# Patient Record
Sex: Female | Born: 1990 | Hispanic: No | Marital: Married | Smoking: Never smoker
Health system: Southern US, Community
[De-identification: ages and names within clinical notes are randomized; demographics above are authoritative.]

## PROBLEM LIST (undated history)

## (undated) ENCOUNTER — Inpatient Hospital Stay (HOSPITAL_COMMUNITY): Payer: Self-pay

## (undated) DIAGNOSIS — O24419 Gestational diabetes mellitus in pregnancy, unspecified control: Secondary | ICD-10-CM

## (undated) HISTORY — PX: NO PAST SURGERIES: SHX2092

## (undated) HISTORY — DX: Gestational diabetes mellitus in pregnancy, unspecified control: O24.419

---

## 2017-07-04 NOTE — L&D Delivery Note (Signed)
Delivery Note At 10:57 AM a viable female was delivered via Vaginal, Spontaneous (Presentation: LOT ; LOA).  APGAR: 8, 9; weight pending  .   Placenta status: intact , spontaneous delivery .  Cord: three vessels with the following complications:none.  Cord pH: N/A  Anesthesia:  Epidural plus lidocaine for repair Episiotomy: None Lacerations: 4th degree Suture Repair: 2.0 and 4.0 Monocryl Est. Blood Loss (mL):  450  Mom to postpartum.  Baby to Couplet care / Skin to Skin.  Calvert CantorSamantha C Shaunie Boehm, CNM 02/17/2018, 11:41 AM

## 2017-12-15 ENCOUNTER — Ambulatory Visit (INDEPENDENT_AMBULATORY_CARE_PROVIDER_SITE_OTHER): Payer: Medicaid Other | Admitting: Obstetrics and Gynecology

## 2017-12-15 ENCOUNTER — Encounter: Payer: Self-pay | Admitting: Obstetrics and Gynecology

## 2017-12-15 ENCOUNTER — Other Ambulatory Visit (HOSPITAL_COMMUNITY)
Admission: RE | Admit: 2017-12-15 | Discharge: 2017-12-15 | Disposition: A | Discharge status: Home / Self Care | Payer: Medicaid Other | Source: Ambulatory Visit | Attending: Obstetrics and Gynecology | Admitting: Obstetrics and Gynecology

## 2017-12-15 VITALS — BP 123/71 | HR 120 | Ht 60.0 in | Wt 125.1 lb

## 2017-12-15 DIAGNOSIS — Z3403 Encounter for supervision of normal first pregnancy, third trimester: Secondary | ICD-10-CM

## 2017-12-15 DIAGNOSIS — Z23 Encounter for immunization: Secondary | ICD-10-CM | POA: Diagnosis not present

## 2017-12-15 DIAGNOSIS — B379 Candidiasis, unspecified: Secondary | ICD-10-CM

## 2017-12-15 DIAGNOSIS — Z3400 Encounter for supervision of normal first pregnancy, unspecified trimester: Secondary | ICD-10-CM | POA: Diagnosis present

## 2017-12-15 DIAGNOSIS — B9689 Other specified bacterial agents as the cause of diseases classified elsewhere: Secondary | ICD-10-CM | POA: Diagnosis not present

## 2017-12-15 DIAGNOSIS — O09899 Supervision of other high risk pregnancies, unspecified trimester: Secondary | ICD-10-CM

## 2017-12-15 DIAGNOSIS — O26899 Other specified pregnancy related conditions, unspecified trimester: Secondary | ICD-10-CM

## 2017-12-15 DIAGNOSIS — O26891 Other specified pregnancy related conditions, first trimester: Secondary | ICD-10-CM

## 2017-12-15 DIAGNOSIS — Z283 Underimmunization status: Secondary | ICD-10-CM

## 2017-12-15 DIAGNOSIS — Z6791 Unspecified blood type, Rh negative: Secondary | ICD-10-CM | POA: Insufficient documentation

## 2017-12-15 DIAGNOSIS — Z3401 Encounter for supervision of normal first pregnancy, first trimester: Secondary | ICD-10-CM

## 2017-12-15 DIAGNOSIS — O0990 Supervision of high risk pregnancy, unspecified, unspecified trimester: Secondary | ICD-10-CM | POA: Insufficient documentation

## 2017-12-15 DIAGNOSIS — O9989 Other specified diseases and conditions complicating pregnancy, childbirth and the puerperium: Secondary | ICD-10-CM

## 2017-12-15 NOTE — Progress Notes (Signed)
INITIAL PRENATAL VISIT NOTE  Subjective:  Joy [name] is a 27 y.o. G1P0 at 5133w3d by 10 weeks US being seen today for her initial prenatal visit. This is a planned pregnancy. She and partner are happy with the pregnancy but he is in [place] due to visa issues. She has an obstetric history significant for na/. She has a medical history significant for n/a.  Patient reports increased discharge in pregnancy.  Contractions: Not present. Vag. Bleeding: None.  Movement: Present. Denies leaking of fluid.   History reviewed. No pertinent past medical history.  History reviewed. No pertinent surgical history.  OB History  Gravida Para Term Preterm AB Living  1            SAB TAB Ectopic Multiple Live Births               # Outcome Date GA Lbr Len/2nd Weight Sex Delivery Anes PTL Lv  1 Current             Social History   Socioeconomic History  . Marital status: Married    Spouse name: Not on file  . Number of children: Not on file  . Years of education: Not on file  . Highest education level: Not on file  Occupational History  . Not on file  Social Needs  . Financial resource strain: Not on file  . Food insecurity:    Worry: Not on file    Inability: Not on file  . Transportation needs:    Medical: Not on file    Non-medical: Not on file  Tobacco Use  . Smoking status: Never Smoker  . Smokeless tobacco: Never Used  Substance and Sexual Activity  . Alcohol use: Never    Frequency: Never  . Drug use: Never  . Sexual activity: Not Currently    Birth control/protection: None  Lifestyle  . Physical activity:    Days per week: Not on file    Minutes per session: Not on file  . Stress: Not on file  Relationships  . Social connections:    Talks on phone: Not on file    Gets together: Not on file    Attends religious service: Not on file    Active member of club or organization: Not on file    Attends meetings of clubs or organizations: Not on file    Relationship  status: Not on file  Other Topics Concern  . Not on file  Social History Narrative  . Not on file    Family History  Problem Relation Age of Onset  . Diabetes Mother      (Not in a hospital admission)  No Known Allergies  Review of Systems: Negative except for what is mentioned in HPI.  Objective:   Vitals:   12/15/17 0926 12/15/17 0929  BP: 123/71   Pulse: (!) 120   Weight: 125 lb 1.6 oz (56.7 kg)   Height:  5' (1.524 m)    Fetal Status:   Fundal Height: 31 cm Movement: Present     Physical Exam: BP 123/71   Pulse (!) 120   Ht 5' (1.524 m)   Wt 125 lb 1.6 oz (56.7 kg)   LMP 05/16/2017   BMI 24.43 kg/m  CONSTITUTIONAL: Well-developed, well-nourished female in no acute distress.  NEUROLOGIC: Alert and oriented to person, place, and time. Normal reflexes, muscle tone coordination. No cranial nerve deficit noted. PSYCHIATRIC: Normal mood and affect. Normal behavior. Normal judgment and thought content. SKIN:  Skin is warm and dry. No rash noted. Not diaphoretic. No erythema. No pallor. HENT:  Normocephalic, atraumatic, External right and left ear normal. Oropharynx is clear and moist EYES: Conjunctivae and EOM are normal. Pupils are equal, round, and reactive to light. No scleral icterus.  NECK: Normal range of motion, supple, no masses CARDIOVASCULAR: Normal heart rate noted, regular rhythm RESPIRATORY: Effort and breath sounds normal, no problems with respiration noted BREASTS: symmetric, non-tender, no masses palpable ABDOMEN: Soft, nontender, nondistended, gravid. GU: normal appearing external female genitalia with 1 cm in length skin tag with appearance of soft cauliflower, verrucous in nature attached to the perineum, 3 mm small verrucous skin tag at posterior introitus, significant thick white discharge at introitus and throughout vagina, nulliparous normal appearing cervix, no lesions noted in vagina MUSCULOSKELETAL: Normal range of motion. EXT:  No edema and  no tenderness. 2+ distal pulses.   Assessment and Plan:  Pregnancy: G1P0 at [redacted]w[redacted]d by 10 weeks [place]  1. Supervision of normal first pregnancy, antepartum - Hemoglobinopathy Evaluation - Obstetric Panel, Including HIV - Culture, OB Urine - GC/Chlamydia probe amp (North)not at [facility] - SMN1 COPY NUMBER ANALYSIS (SMA Carrier Screen) - Hemoglobin A1c - CBC - HIV antibody - RPR - [place] MFM OB DETAIL +14 WK; Future - Glucose tolerance, 1 hour  2. Rh negative state in antepartum period O negative per records, patient reports B positive and has not gotten Rho Gam   Preterm labor symptoms and general obstetric precautions including but not limited to vaginal bleeding, contractions, leaking of fluid and fetal movement were reviewed in detail with the patient.  Please refer to After Visit Summary for other counseling recommendations.   Return in about 2 weeks (around 12/29/2017) for OB visit (MD).  Conan Bowens 12/15/2017 10:03 AM

## 2017-12-16 ENCOUNTER — Other Ambulatory Visit: Payer: Self-pay

## 2017-12-16 ENCOUNTER — Encounter (HOSPITAL_COMMUNITY): Payer: Self-pay

## 2017-12-16 ENCOUNTER — Inpatient Hospital Stay (HOSPITAL_COMMUNITY)
Admission: AD | Admit: 2017-12-16 | Discharge: 2017-12-16 | Disposition: A | Discharge status: Home / Self Care | Payer: Medicaid Other | Source: Ambulatory Visit | Attending: Obstetrics and Gynecology | Admitting: Obstetrics and Gynecology

## 2017-12-16 DIAGNOSIS — O368130 Decreased fetal movements, third trimester, not applicable or unspecified: Secondary | ICD-10-CM | POA: Insufficient documentation

## 2017-12-16 DIAGNOSIS — Z3689 Encounter for other specified antenatal screening: Secondary | ICD-10-CM

## 2017-12-16 DIAGNOSIS — Z3A30 30 weeks gestation of pregnancy: Secondary | ICD-10-CM

## 2017-12-16 DIAGNOSIS — Z833 Family history of diabetes mellitus: Secondary | ICD-10-CM | POA: Diagnosis not present

## 2017-12-16 LAB — URINALYSIS, ROUTINE W REFLEX MICROSCOPIC
Bilirubin Urine: NEGATIVE
Glucose, UA: NEGATIVE mg/dL
Hgb urine dipstick: NEGATIVE
Ketones, ur: NEGATIVE mg/dL
Nitrite: NEGATIVE
Protein, ur: NEGATIVE mg/dL
SPECIFIC GRAVITY, URINE: 1.005 (ref 1.005–1.030)
pH: 7 (ref 5.0–8.0)

## 2017-12-16 LAB — GLUCOSE TOLERANCE, 1 HOUR: GLUCOSE, 1HR PP: 159 mg/dL (ref 65–199)

## 2017-12-16 NOTE — MAU Provider Note (Signed)
History   [id number]   Chief Complaint  Patient presents with  . Decreased Fetal Movement    HPI Joy [name] is a 27 y.o. female  G1P0 here with report of decreased fetal movement since this mornign.  Reports feeling the baby move approximately two times in the past 24 hour.  Denies vaginal bleeding or leaking of fluid. Denies abdominal pain or contractions.   Patient's last menstrual period was 05/16/2017.  OB History  Gravida Para Term Preterm AB Living  1            SAB TAB Ectopic Multiple Live Births               # Outcome Date GA Lbr Len/2nd Weight Sex Delivery Anes PTL Lv  1 Current             Past Medical History:  Diagnosis Date  . Medical history non-contributory     Family History  Problem Relation Age of Onset  . Diabetes Mother     Social History   Socioeconomic History  . Marital status: Married    Spouse name: Not on file  . Number of children: Not on file  . Years of education: Not on file  . Highest education level: Not on file  Occupational History  . Not on file  Social Needs  . Financial resource strain: Not on file  . Food insecurity:    Worry: Not on file    Inability: Not on file  . Transportation needs:    Medical: Not on file    Non-medical: Not on file  Tobacco Use  . Smoking status: Never Smoker  . Smokeless tobacco: Never Used  Substance and Sexual Activity  . Alcohol use: Never    Frequency: Never  . Drug use: Never  . Sexual activity: Not Currently    Birth control/protection: None  Lifestyle  . Physical activity:    Days per week: Not on file    Minutes per session: Not on file  . Stress: Not on file  Relationships  . Social connections:    Talks on phone: Not on file    Gets together: Not on file    Attends religious service: Not on file    Active member of club or organization: Not on file    Attends meetings of clubs or organizations: Not on file    Relationship status: Not on file  Other Topics Concern  .  Not on file  Social History Narrative  . Not on file    No Known Allergies  No current facility-administered medications on file prior to encounter.    Current Outpatient Medications on File Prior to Encounter  Medication Sig Dispense Refill  . Prenatal Multivit-Min-Fe-FA (PRENATAL VITAMINS PO) Take by mouth.       Review of Systems  Constitutional: Negative.   Gastrointestinal: Negative.   Genitourinary: Negative.    Physical Exam   Vitals:   12/16/17 1709 12/16/17 1710  BP:  122/79  Pulse:  (!) 108  Resp:  16  Temp:  98.1 F (36.7 C)  TempSrc:  Oral  SpO2:  100%  Weight: 127 lb 1.9 oz (57.7 kg)     Physical Exam  Nursing note and vitals reviewed. Constitutional: She is oriented to person, place, and time. She appears well-developed and well-nourished. No distress.  HENT:  Head: Normocephalic and atraumatic.  Eyes: Conjunctivae are normal. Right eye exhibits no discharge. Left eye exhibits no discharge. No scleral icterus.  Neck: Normal range of motion.  Respiratory: Effort normal. No respiratory distress.  GI: Soft. There is no tenderness.  Neurological: She is alert and oriented to person, place, and time.  Skin: Skin is warm and dry. She is not diaphoretic.  Psychiatric: She has a normal mood and affect. Her behavior is normal. Judgment and thought content normal.    MAU Course  Procedures Results for orders placed or performed during the hospital encounter of 12/16/17 (from the past 24 hour(s))  Urinalysis, Routine w reflex microscopic     Status: Abnormal   Collection Time: 12/16/17  5:22 PM  Result Value Ref Range   Color, Urine YELLOW YELLOW   APPearance CLEAR CLEAR   Specific Gravity, Urine 1.005 1.005 - 1.030   pH 7.0 5.0 - 8.0   Glucose, UA NEGATIVE NEGATIVE mg/dL   Hgb urine dipstick NEGATIVE NEGATIVE   Bilirubin Urine NEGATIVE NEGATIVE   Ketones, ur NEGATIVE NEGATIVE mg/dL   Protein, ur NEGATIVE NEGATIVE mg/dL   Nitrite NEGATIVE NEGATIVE    Leukocytes, UA TRACE (A) NEGATIVE   RBC / HPF 0-5 0 - 5 RBC/hpf   WBC, UA 0-5 0 - 5 WBC/hpf   Bacteria, UA RARE (A) NONE SEEN   Squamous Epithelial / LPF 0-5 0 - 5   Mucus PRESENT     MDM NST:  Baseline: 130 bpm, Variability: Good {> 6 bpm), Accelerations: Reactive and Decelerations: Absent  Reactive NST Fetal movement heard through monitor and felt during abdominal exam Pt reassured  Assessment and Plan  A: 1. Decreased fetal movements in third trimester, single or unspecified fetus   2. NST (non-stress test) reactive   3. [redacted] weeks gestation of pregnancy    P; Discharge home Discussed fetal movement & given fetal movement form Discussed reasons to return to MAU   Judeth Horn, NP 12/16/2017 5:49 PM

## 2017-12-16 NOTE — Progress Notes (Addendum)
G1 @ 30.[redacted] wksga. Here dt decreased FM. Last time felt baby move last night. Denies LOF or bleeding.   EFM applied with slight tilt to right side.

## 2017-12-16 NOTE — MAU Note (Signed)
Joy HutchingSadia [name] is a 27 y.o. at 5721w4d here in MAU reporting:  +decreased fetal movement. Last felt baby move yesterday. No movement today.  Pain score: none Vitals:   12/16/17 1710  BP: 122/79  Pulse: (!) 108  Resp: 16  Temp: 98.1 F (36.7 C)  SpO2: 100%     FHT:128 Lab orders placed from triage:  ua

## 2017-12-16 NOTE — Discharge Instructions (Signed)

## 2017-12-18 LAB — CULTURE, OB URINE

## 2017-12-18 LAB — URINE CULTURE, OB REFLEX

## 2017-12-18 LAB — CERVICOVAGINAL ANCILLARY ONLY
CHLAMYDIA, DNA PROBE: NEGATIVE
Candida vaginitis: POSITIVE — AB
NEISSERIA GONORRHEA: NEGATIVE

## 2017-12-18 MED ORDER — CLOTRIMAZOLE 1 % VA CREA: 1.0000 | TOPICAL_CREAM | Freq: Every day | VAGINAL | 0 refills | Status: DC

## 2017-12-18 NOTE — Addendum Note (Signed)
Addended by: Leroy LibmanAVIS, Murphy Bundick on: 12/18/2017 01:41 PM   Modules accepted: Orders

## 2017-12-21 LAB — HEMOGLOBINOPATHY EVALUATION
Ferritin: 23 ng/mL (ref 15–150)
HGB SOLUBILITY: NEGATIVE
HGB VARIANT: 0 %
Hgb A2 Quant: 1.9 % (ref 1.8–3.2)
Hgb A: 98.1 % (ref 96.4–98.8)
Hgb C: 0 %
Hgb F Quant: 0 % (ref 0.0–2.0)
Hgb S: 0 %

## 2017-12-21 LAB — SMN1 COPY NUMBER ANALYSIS (SMA CARRIER SCREENING)

## 2017-12-21 LAB — OBSTETRIC PANEL, INCLUDING HIV
Antibody Screen: NEGATIVE
Basophils Absolute: 0 10*3/uL (ref 0.0–0.2)
Basos: 0 %
EOS (ABSOLUTE): 0.1 10*3/uL (ref 0.0–0.4)
EOS: 1 %
HEMOGLOBIN: 10.2 g/dL — AB (ref 11.1–15.9)
HEP B S AG: NEGATIVE
HIV Screen 4th Generation wRfx: NONREACTIVE
Hematocrit: 31 % — ABNORMAL LOW (ref 34.0–46.6)
IMMATURE GRANULOCYTES: 4 %
Immature Grans (Abs): 0.5 10*3/uL — ABNORMAL HIGH (ref 0.0–0.1)
LYMPHS: 12 %
Lymphocytes Absolute: 1.5 10*3/uL (ref 0.7–3.1)
MCH: 28 pg (ref 26.6–33.0)
MCHC: 32.9 g/dL (ref 31.5–35.7)
MCV: 85 fL (ref 79–97)
MONOS ABS: 1.1 10*3/uL — AB (ref 0.1–0.9)
Monocytes: 8 %
NEUTROS PCT: 75 %
Neutrophils Absolute: 9.8 10*3/uL — ABNORMAL HIGH (ref 1.4–7.0)
Platelets: 310 10*3/uL (ref 150–450)
RBC: 3.64 x10E6/uL — AB (ref 3.77–5.28)
RDW: 19.2 % — ABNORMAL HIGH (ref 12.3–15.4)
RH TYPE: NEGATIVE
RPR: NONREACTIVE
WBC: 13 10*3/uL — ABNORMAL HIGH (ref 3.4–10.8)

## 2017-12-21 LAB — HEMOGLOBIN A1C
Est. average glucose Bld gHb Est-mCnc: 108 mg/dL
HEMOGLOBIN A1C: 5.4 % (ref 4.8–5.6)

## 2017-12-22 ENCOUNTER — Encounter (HOSPITAL_COMMUNITY): Payer: Self-pay

## 2017-12-22 ENCOUNTER — Ambulatory Visit (HOSPITAL_COMMUNITY)
Admission: RE | Admit: 2017-12-22 | Discharge: 2017-12-22 | Disposition: A | Discharge status: Home / Self Care | Payer: Medicaid Other | Source: Ambulatory Visit | Attending: Obstetrics and Gynecology | Admitting: Obstetrics and Gynecology

## 2017-12-22 DIAGNOSIS — Z363 Encounter for antenatal screening for malformations: Secondary | ICD-10-CM | POA: Diagnosis not present

## 2017-12-22 DIAGNOSIS — Z6791 Unspecified blood type, Rh negative: Secondary | ICD-10-CM

## 2017-12-22 DIAGNOSIS — Z3A31 31 weeks gestation of pregnancy: Secondary | ICD-10-CM | POA: Insufficient documentation

## 2017-12-22 DIAGNOSIS — O26899 Other specified pregnancy related conditions, unspecified trimester: Secondary | ICD-10-CM

## 2017-12-22 DIAGNOSIS — Z3400 Encounter for supervision of normal first pregnancy, unspecified trimester: Secondary | ICD-10-CM | POA: Diagnosis present

## 2017-12-25 DIAGNOSIS — O09899 Supervision of other high risk pregnancies, unspecified trimester: Secondary | ICD-10-CM | POA: Insufficient documentation

## 2017-12-25 DIAGNOSIS — Z283 Underimmunization status: Secondary | ICD-10-CM | POA: Insufficient documentation

## 2017-12-25 DIAGNOSIS — O9989 Other specified diseases and conditions complicating pregnancy, childbirth and the puerperium: Secondary | ICD-10-CM

## 2017-12-28 ENCOUNTER — Encounter: Payer: Self-pay | Admitting: Obstetrics and Gynecology

## 2018-01-03 ENCOUNTER — Ambulatory Visit (INDEPENDENT_AMBULATORY_CARE_PROVIDER_SITE_OTHER): Payer: Medicaid Other | Admitting: Obstetrics & Gynecology

## 2018-01-03 ENCOUNTER — Encounter: Payer: Self-pay | Admitting: Obstetrics & Gynecology

## 2018-01-03 VITALS — BP 112/62 | HR 96 | Wt 130.0 lb

## 2018-01-03 DIAGNOSIS — Z3400 Encounter for supervision of normal first pregnancy, unspecified trimester: Secondary | ICD-10-CM

## 2018-01-03 DIAGNOSIS — O360930 Maternal care for other rhesus isoimmunization, third trimester, not applicable or unspecified: Secondary | ICD-10-CM

## 2018-01-03 DIAGNOSIS — O09899 Supervision of other high risk pregnancies, unspecified trimester: Secondary | ICD-10-CM

## 2018-01-03 DIAGNOSIS — Z3403 Encounter for supervision of normal first pregnancy, third trimester: Secondary | ICD-10-CM

## 2018-01-03 DIAGNOSIS — O9989 Other specified diseases and conditions complicating pregnancy, childbirth and the puerperium: Secondary | ICD-10-CM

## 2018-01-03 DIAGNOSIS — Z283 Underimmunization status: Secondary | ICD-10-CM

## 2018-01-03 DIAGNOSIS — Z6791 Unspecified blood type, Rh negative: Secondary | ICD-10-CM

## 2018-01-03 DIAGNOSIS — O26899 Other specified pregnancy related conditions, unspecified trimester: Secondary | ICD-10-CM

## 2018-01-03 MED ORDER — RHO D IMMUNE GLOBULIN 1500 UNIT/2ML IJ SOSY
300.0000 ug | PREFILLED_SYRINGE | Freq: Once | INTRAMUSCULAR | Status: AC
  Administered 2018-01-03: 300 ug via INTRAMUSCULAR

## 2018-01-03 NOTE — Patient Instructions (Signed)
Rh0 [D] Immune Globulin injection  What is this medicine?  RhO [D] IMMUNE GLOBULIN (i MYOON GLOB yoo lin) is used to treat idiopathic thrombocytopenic purpura (ITP). This medicine is used in RhO negative mothers who are pregnant with a RhO positive child. It is also used after a transfusion of RhO positive blood into a RhO negative person.  This medicine may be used for other purposes; ask your health care provider or pharmacist if you have questions.  COMMON BRAND NAME(S): BayRho-D, HyperRHO S/D, MICRhoGAM, RhoGAM, Rhophylac, WinRho SDF  What should I tell my health care provider before I take this medicine?  They need to know if you have any of these conditions:  -bleeding disorders  -low levels of immunoglobulin A in the body  -no spleen  -an unusual or allergic reaction to human immune globulin, other medicines, foods, dyes, or preservatives  -pregnant or trying to get pregnant  -breast-feeding  How should I use this medicine?  This medicine is for injection into a muscle or into a vein. It is given by a health care professional in a hospital or clinic setting.  Talk to your pediatrician regarding the use of this medicine in children. This medicine is not approved for use in children.  Overdosage: If you think you have taken too much of this medicine contact a poison control center or emergency room at once.  NOTE: This medicine is only for you. Do not share this medicine with others.  What if I miss a dose?  It is important not to miss your dose. Call your doctor or health care professional if you are unable to keep an appointment.  What may interact with this medicine?  -live virus vaccines, like measles, mumps, or rubella  This list may not describe all possible interactions. Give your health care provider a list of all the medicines, herbs, non-prescription drugs, or dietary supplements you use. Also tell them if you smoke, drink alcohol, or use illegal drugs. Some items may interact with your medicine.   What should I watch for while using this medicine?  This medicine is made from human blood. It may be possible to pass an infection in this medicine. Talk to your doctor about the risks and benefits of this medicine.  This medicine may interfere with live virus vaccines. Before you get live virus vaccines tell your health care professional if you have received this medicine within the past 3 months.  What side effects may I notice from receiving this medicine?  Side effects that you should report to your doctor or health care professional as soon as possible:  -allergic reactions like skin rash, itching or hives, swelling of the face, lips, or tongue  -breathing problems  -chest pain or tightness  -yellowing of the eyes or skin  Side effects that usually do not require medical attention (report to your doctor or health care professional if they continue or are bothersome):  -fever  -pain and tenderness at site where injected  This list may not describe all possible side effects. Call your doctor for medical advice about side effects. You may report side effects to FDA at 1-800-FDA-1088.  Where should I keep my medicine?  This drug is given in a hospital or clinic and will not be stored at home.  NOTE: This sheet is a summary. It may not cover all possible information. If you have questions about this medicine, talk to your doctor, pharmacist, or health care provider.  © 2018 [employer]/Gold Standard (  2008-02-18 14:06:10)

## 2018-01-03 NOTE — Progress Notes (Signed)
   PRENATAL VISIT NOTE  Subjective:  Joy [name] is a 27 y.o. G1P0 at 78w1dbeing seen today for ongoing prenatal care.  She is currently monitored for the following issues for this low-risk pregnancy and has Supervision of normal first pregnancy, antepartum; Rh negative state in antepartum period; and Rubella non-immune status, antepartum on their problem list.  Patient reports URI sx- improving.  Contractions: Not present. Vag. Bleeding: None.  Movement: Present. Denies leaking of fluid.   The following portions of the patient's history were reviewed and updated as appropriate: allergies, current medications, past family history, past medical history, past social history, past surgical history and problem list. Problem list updated.  Objective:   Vitals:   01/03/18 1537  BP: 112/62  Pulse: 96  Weight: 130 lb (59 kg)    Fetal Status:     Movement: Present     General:  Alert, oriented and cooperative. Patient is in no acute distress.  Skin: Skin is warm and dry. No rash noted.   Cardiovascular: Normal heart rate noted  Respiratory: Normal respiratory effort, no problems with respiration noted  Abdomen: Soft, gravid, appropriate for gestational age.  Pain/Pressure: Present     Pelvic: Cervical exam deferred        Extremities: Normal range of motion.  Edema: None  Mental Status: Normal mood and affect. Normal behavior. Normal judgment and thought content.   Assessment and Plan:  Pregnancy: G1P0 at 32w1d1. Supervision of normal first pregnancy, antepartum D/w pt [place] had [place] anatomy that was WNL. Pt does not report a need for a f/u [place]  2. Rubella non-immune status, antepartum Needs MMR PP  3. Rh negative state in antepartum period Given Rhogam today  4. Anemia Begin feso4 in addition to PNV   Preterm labor symptoms and general obstetric precautions including but not limited to vaginal bleeding, contractions, leaking of fluid and fetal movement were  reviewed in detail with the patient. Please refer to After Visit Summary for other counseling recommendations.  Return in about 2 weeks (around 01/17/2018).  No future appointments.  CaLavonia DraftsMD

## 2018-01-08 ENCOUNTER — Encounter: Payer: Self-pay | Admitting: Obstetrics & Gynecology

## 2018-01-08 ENCOUNTER — Encounter (HOSPITAL_COMMUNITY): Payer: Self-pay | Admitting: *Deleted

## 2018-01-08 ENCOUNTER — Inpatient Hospital Stay (HOSPITAL_COMMUNITY)
Admission: AD | Admit: 2018-01-08 | Discharge: 2018-01-09 | Disposition: A | Discharge status: Home / Self Care | Payer: Medicaid Other | Source: Ambulatory Visit | Attending: Obstetrics and Gynecology | Admitting: Obstetrics and Gynecology

## 2018-01-08 DIAGNOSIS — M545 Low back pain, unspecified: Secondary | ICD-10-CM

## 2018-01-08 DIAGNOSIS — O9989 Other specified diseases and conditions complicating pregnancy, childbirth and the puerperium: Secondary | ICD-10-CM

## 2018-01-08 DIAGNOSIS — Z3400 Encounter for supervision of normal first pregnancy, unspecified trimester: Secondary | ICD-10-CM

## 2018-01-08 DIAGNOSIS — O4703 False labor before 37 completed weeks of gestation, third trimester: Secondary | ICD-10-CM | POA: Diagnosis not present

## 2018-01-08 DIAGNOSIS — O98813 Other maternal infectious and parasitic diseases complicating pregnancy, third trimester: Secondary | ICD-10-CM | POA: Diagnosis not present

## 2018-01-08 DIAGNOSIS — O26899 Other specified pregnancy related conditions, unspecified trimester: Secondary | ICD-10-CM

## 2018-01-08 DIAGNOSIS — Z6791 Unspecified blood type, Rh negative: Secondary | ICD-10-CM | POA: Insufficient documentation

## 2018-01-08 DIAGNOSIS — Z3A33 33 weeks gestation of pregnancy: Secondary | ICD-10-CM | POA: Insufficient documentation

## 2018-01-08 DIAGNOSIS — N859 Noninflammatory disorder of uterus, unspecified: Secondary | ICD-10-CM

## 2018-01-08 DIAGNOSIS — O479 False labor, unspecified: Secondary | ICD-10-CM

## 2018-01-08 DIAGNOSIS — B373 Candidiasis of vulva and vagina: Secondary | ICD-10-CM | POA: Diagnosis not present

## 2018-01-08 DIAGNOSIS — B3731 Acute candidiasis of vulva and vagina: Secondary | ICD-10-CM

## 2018-01-08 DIAGNOSIS — Z2839 Other underimmunization status: Secondary | ICD-10-CM

## 2018-01-08 DIAGNOSIS — O26893 Other specified pregnancy related conditions, third trimester: Secondary | ICD-10-CM | POA: Diagnosis not present

## 2018-01-08 DIAGNOSIS — R109 Unspecified abdominal pain: Secondary | ICD-10-CM | POA: Diagnosis present

## 2018-01-08 DIAGNOSIS — Z283 Underimmunization status: Secondary | ICD-10-CM

## 2018-01-08 DIAGNOSIS — N858 Other specified noninflammatory disorders of uterus: Secondary | ICD-10-CM

## 2018-01-08 LAB — URINALYSIS, ROUTINE W REFLEX MICROSCOPIC
Bilirubin Urine: NEGATIVE
GLUCOSE, UA: NEGATIVE mg/dL
HGB URINE DIPSTICK: NEGATIVE
KETONES UR: NEGATIVE mg/dL
NITRITE: NEGATIVE
PROTEIN: NEGATIVE mg/dL
Specific Gravity, Urine: 1.005 (ref 1.005–1.030)
pH: 7 (ref 5.0–8.0)

## 2018-01-08 LAB — WET PREP, GENITAL
Clue Cells Wet Prep HPF POC: NONE SEEN
Sperm: NONE SEEN
Trich, Wet Prep: NONE SEEN

## 2018-01-08 MED ORDER — LACTATED RINGERS IV BOLUS
1000.0000 mL | Freq: Once | INTRAVENOUS | Status: DC
  Administered 2018-01-08: 1000 mL via INTRAVENOUS

## 2018-01-08 NOTE — MAU Provider Note (Signed)
Chief Complaint:  Abdominal Pain   First Provider Initiated Contact with Patient 01/08/18 2249      HPI: Joy [name] is a 27 y.o. G1P0 at [redacted]w[redacted]d who presents to MAU reporting cramps that started this morning. States having abdominal pain that feels like period cramps. Pain located in lower abdomen and back. Moving makes it worse and sitting makes it feel better. Pain comes and goes. Denies leakage of fluid, vaginal discharge, or vaginal bleeding. Good fetal movement.   Pregnancy Course:  Receives prenatal care at [facility]  Past Medical History: Past Medical History:  Diagnosis Date  . Medical history non-contributory     Past obstetric history: OB History  Gravida Para Term Preterm AB Living  1         0  SAB TAB Ectopic Multiple Live Births               # Outcome Date GA Lbr Len/2nd Weight Sex Delivery Anes PTL Lv  1 Current             Past Surgical History: Past Surgical History:  Procedure Laterality Date  . NO PAST SURGERIES       Family History: Family History  Problem Relation Age of Onset  . Diabetes Mother     Social History: Social History   Tobacco Use  . Smoking status: Never Smoker  . Smokeless tobacco: Never Used  Substance Use Topics  . Alcohol use: Never    Frequency: Never  . Drug use: Never    Allergies: No Known Allergies  Meds:  Medications Prior to Admission  Medication Sig Dispense Refill Last Dose  . clotrimazole (GYNE-LOTRIMIN) 1 % vaginal cream Place 1 Applicatorful vaginally at bedtime. (Patient not taking: Reported on 01/03/2018) 30 g 0 Not Taking  . Prenatal Multivit-Min-Fe-FA (PRENATAL VITAMINS PO) Take by mouth.   Taking    I have reviewed patient's Past Medical Hx, Surgical Hx, Family Hx, Social Hx, medications and allergies.   ROS:  All systems reviewed and are negative for acute change except as noted in the HPI.   Physical Exam   Patient Vitals for the past 24 hrs:  BP Temp Temp src Pulse Resp Height Weight  01/08/18  2133 111/62 97.8 F (36.6 C) Oral 97 18 5' (1.524 m) 59.8 kg (131 lb 12 oz)   Constitutional: Well-developed, well-nourished female in no acute distress.  Cardiovascular: normal rate and rhythm, pulses intact Respiratory: normal rate and effort.  GI: Abd soft, non-tender, gravid appropriate for gestational age.  MS: Extremities nontender, no edema, normal ROM Neurologic: Alert and oriented x 4.  GU: Neg CVAT. Pelvic: NEFG, thick white vaginal discharge, no blood, cervix clean. No CMT Psych: normal mood and affect  Dilation: Fingertip Effacement (%): Thick Station: Ballotable Exam by:: Doroteo Glassman, MD    Labs: Results for orders placed or performed during the hospital encounter of 01/08/18 (from the past 24 hour(s))  Urinalysis, Routine w reflex microscopic     Status: Abnormal   Collection Time: 01/08/18  9:47 PM  Result Value Ref Range   Color, Urine STRAW (A) YELLOW   APPearance CLEAR CLEAR   Specific Gravity, Urine 1.005 1.005 - 1.030   pH 7.0 5.0 - 8.0   Glucose, UA NEGATIVE NEGATIVE mg/dL   Hgb urine dipstick NEGATIVE NEGATIVE   Bilirubin Urine NEGATIVE NEGATIVE   Ketones, ur NEGATIVE NEGATIVE mg/dL   Protein, ur NEGATIVE NEGATIVE mg/dL   Nitrite NEGATIVE NEGATIVE   Leukocytes, UA LARGE (A) NEGATIVE  RBC / HPF 0-5 0 - 5 RBC/hpf   WBC, UA 6-10 0 - 5 WBC/hpf   Bacteria, UA RARE (A) NONE SEEN   Squamous Epithelial / LPF 0-5 0 - 5   Mucus PRESENT     Imaging:  [place] Mfm Ob Detail +14 Wk  Result Date: 12/22/2017 ----------------------------------------------------------------------  OBSTETRICS REPORT                      (Signed Final 12/22/2017 02:44 pm) ---------------------------------------------------------------------- Patient Info  ID #:       [id number]                          D.O.B.:  1990/10/25 (27 yrs)  Name:       Joy [name]                     Visit Date: 12/22/2017 01:27 pm ---------------------------------------------------------------------- Performed By   Performed By:     Emeline DarlingKasie E Kiser BS,      [address]. [address]     [address]                    [place]                                                             [address]  Attending:        Charlsie MerlesMark Newman MD         [facility]  Referred By:      Conan BowensKELLY M DAVIS                    MD ---------------------------------------------------------------------- Orders   #  Description                                 Code   1  US MFM OB DETAIL +14 WK                     76811.01  ----------------------------------------------------------------------   #  Ordered By               Order #        Accession #    Episode #   1  Leroy LibmanKELLY DAVIS              [id number]      [id number]     [id number]  ---------------------------------------------------------------------- Indications   [redacted] weeks gestation of pregnancy                Z3A.31   Encounter for antenatal screening for          Z36.3   malformations   Transfer of Care  ---------------------------------------------------------------------- OB History  Blood Type:            Height:  5'     Weight (lb):  126       BMI:  24.61  Gravidity:    1 ---------------------------------------------------------------------- Fetal Evaluation  Num Of Fetuses:     1  Fetal Heart         122  Rate(bpm):  Cardiac Activity:  Observed  Presentation:       Cephalic  Placenta:           Posterior Fundal, above cervical os  P. Cord Insertion:  Visualized  Amniotic Fluid  AFI FV:      Subjectively within normal limits  AFI Sum(cm)     %Tile       Largest Pocket(cm)  12.84           38          3.97  RUQ(cm)       RLQ(cm)       LUQ(cm)        LLQ(cm)  2.14          3.32          3.97           3.42 ---------------------------------------------------------------------- Biometry  BPD:      81.7  mm     G. Age:  32w 6d         80  %    CI:        78.53   %    70 - 86                                                          FL/HC:      20.4   %    19.3 - 21.3  HC:      291.6  mm     G. Age:  32w 1d          33  %    HC/AC:      1.02        0.96 - 1.17  AC:      285.5  mm     G. Age:  32w 4d         79  %    FL/BPD:     72.8   %    71 - 87  FL:       59.5  mm     G. Age:  31w 0d         25  %    FL/AC:      20.8   %    20 - 24  HUM:        54  mm     G. Age:  31w 3d         52  %  Est. FW:    1899  gm      4 lb 3 oz     68  % ---------------------------------------------------------------------- Gestational Age  LMP:           31w 3d        Date:  05/16/17                 EDD:   02/20/18  U/S Today:     32w 1d                                        EDD:   02/15/18  Best:          31w 3d     Det. By:  LMP  (05/16/17)  EDD:   02/20/18 ---------------------------------------------------------------------- Anatomy  Cranium:               Appears normal         Aortic Arch:            Not well visualized  Cavum:                 Appears normal         Ductal Arch:            Not well visualized  Ventricles:            Appears normal         Diaphragm:              Appears normal  Choroid Plexus:        Appears normal         Stomach:                Appears normal, left                                                                        sided  Cerebellum:            Appears normal         Abdomen:                Appears normal  Posterior Fossa:       Appears normal         Abdominal Wall:         Not well visualized  Nuchal Fold:           Not applicable (>20    Cord Vessels:           Appears normal ([redacted]                         wks GA)                                        vessel cord)  Face:                  Appears normal         Kidneys:                Appear normal                         (orbits and profile)  Lips:                  Appears normal         Bladder:                Appears normal  Thoracic:              Appears normal         Spine:                  Appears normal  Heart:  Not well visualized    Upper Extremities:      Not well visualized  RVOT:                  Appears normal          Lower Extremities:      Appears normal  LVOT:                  Appears normal  Other:  Female gender. Technically difficult due to fetal position. ---------------------------------------------------------------------- Cervix Uterus Adnexa  Cervix  Not visualized (advanced GA >29wks) ---------------------------------------------------------------------- Impression  Singleton intrauterine pregnancy at 31+3 weeks here for  anatomic survey  Review of the anatomy shows no sonographic markers for  aneuploidy or structural anomalies  However, views of the fetal heart and distal extremities  should be considered suboptimal secondary to fetal position  Amniotic fluid volume is normal  Estimated fetal weight shows growth in the 68th percentile ---------------------------------------------------------------------- Recommendations  Recommend follow-up ultrasound examination in 4 weeks for  completion of the anatomic survey ----------------------------------------------------------------------                 Charlsie Merles, MD Electronically Signed Final Report   12/22/2017 02:44 pm ----------------------------------------------------------------------   MAU Management/MDM: Vitals and nursing notes reviewed  UA unremarkable. Wet prep with signs of yeast vaginitis. This could be the course of some of her uterine irritaibility. The abdominal pain sounds like Braxton-Hicks contractions. Educated mother on what this is since this is her first pregnancy. Cervix is closed. No contractions on monitor. Rule out PTL.   Orders Placed This Encounter  Procedures  . Wet prep, genital  . OB Urine Culture  . Urinalysis, Routine w reflex microscopic    Meds ordered this encounter  Medications  . lactated ringers bolus 1,000 mL    Plan of care reviewed with patient, including labs and tests ordered and medical treatment.  Consult none.  Treatments in MAU included IV fluid bolus.   I personally reviewed the patient's  NST today, found to be REACTIVE. 125 bpm, mod var, +accels, no decels. CTX: occasional  ASSESSMENT 1. Yeast vaginitis   2. Rubella non-immune status, antepartum   3. Supervision of normal first pregnancy, antepartum   4. Rh negative state in antepartum period   5. Uterine irritability   6. Acute left-sided low back pain without sciatica   7. Braxton Hick's contraction     PLAN Discharge home in stable condition. Counseled on return precautions Labor precautions given Follow-up with OB provider Handout given    Allergies as of 01/09/2018   No Known Allergies     Medication List    STOP taking these medications   clotrimazole 1 % vaginal cream Commonly known as:  GYNE-LOTRIMIN     TAKE these medications   miconazole 2 % vaginal cream Commonly known as:  MONISTAT 7 Place 1 Applicatorful vaginally at bedtime for 7 days.   PRENATAL VITAMINS PO Take by mouth.       Caryl Ada, DO OB Fellow [facility] 01/08/2018, 11:00 PM

## 2018-01-08 NOTE — MAU Note (Signed)
PT SAYS  CRAMPING STARTED THIS AM.     AT 730- WHILE WASHING DISHES - BACK AND ABD HURTING.   SITTING FEELS BETTER.  GETS   [facility] WITH [facility] -

## 2018-01-08 NOTE — MAU Note (Signed)
Urine in lab 

## 2018-01-09 DIAGNOSIS — B373 Candidiasis of vulva and vagina: Secondary | ICD-10-CM | POA: Diagnosis not present

## 2018-01-09 MED ORDER — MICONAZOLE NITRATE 2 % VA CREA: 1.0000 | TOPICAL_CREAM | Freq: Every day | VAGINAL | 0 refills | Status: AC

## 2018-01-09 NOTE — Discharge Instructions (Signed)

## 2018-01-10 LAB — CULTURE, OB URINE

## 2018-01-17 ENCOUNTER — Ambulatory Visit (INDEPENDENT_AMBULATORY_CARE_PROVIDER_SITE_OTHER): Payer: Medicaid Other | Admitting: Obstetrics & Gynecology

## 2018-01-17 VITALS — BP 114/69 | HR 106 | Wt 133.1 lb

## 2018-01-17 DIAGNOSIS — Z3403 Encounter for supervision of normal first pregnancy, third trimester: Secondary | ICD-10-CM

## 2018-01-17 DIAGNOSIS — O26899 Other specified pregnancy related conditions, unspecified trimester: Secondary | ICD-10-CM

## 2018-01-17 DIAGNOSIS — O26893 Other specified pregnancy related conditions, third trimester: Secondary | ICD-10-CM

## 2018-01-17 DIAGNOSIS — Z6791 Unspecified blood type, Rh negative: Secondary | ICD-10-CM

## 2018-01-17 DIAGNOSIS — Z283 Underimmunization status: Secondary | ICD-10-CM

## 2018-01-17 DIAGNOSIS — O9989 Other specified diseases and conditions complicating pregnancy, childbirth and the puerperium: Secondary | ICD-10-CM

## 2018-01-17 DIAGNOSIS — Z2839 Other underimmunization status: Secondary | ICD-10-CM

## 2018-01-17 DIAGNOSIS — Z3400 Encounter for supervision of normal first pregnancy, unspecified trimester: Secondary | ICD-10-CM

## 2018-01-17 NOTE — Patient Instructions (Signed)
Natural Childbirth Natural childbirth is going through labor and delivery without any drugs to relieve pain. Additionally, fetal monitors are not used, a delivery is not done, and a surgical cut to enlarge the vaginal opening (episiotomy) is not made. With the help of a birthing professional (midwife or health care provider), you direct your own labor and delivery. Many women choose natural childbirth because it makes them feel more in control and in touch with their labor and delivery. Some woman also choose natural childbirth because they are concerned about medicines affecting them and their babies. Pregnant women with a high-risk pregnancy should not attempt natural childbirth. It is better to deliver the infant in a hospital if an emergency situation arises. Sometimes, a health care provider has to get involved for the health and safety of the mother and infant. Techniques for natural childbirth  The Lamaze method-This method teaches parents that having a baby is normal, healthy, and natural. It also teaches the mother to take a neutral position regarding pain medicine and numbing medicines and to make an informed decision about using these medicines when the time comes.  The Bradley method (also called husband-coached birth)-This method teaches the father or partner to be the birth coach. It encourages the mother to exercise and eat a balanced, nutritious diet. It also involves relaxation and deep breathing exercises and preparing the parents for emergency situations. Methods of dealing with labor pain and delivery  Meditation.  Yoga.  Hypnosis.  Acupuncture.  Massage.  Changing positions (walking, rocking, showering, leaning on birth balls).  Lying in warm water or a whirlpool bath.  Finding an activity that keeps your mind off of the labor pain.  Listening to soft music.  Focusing on a particular object (visual imagery). Before going into labor  Be sure you and your spouse or  partner are in agreement about having a natural childbirth.  Decide if your health care provider or a midwife will deliver your baby.  Decide if you will have your baby in the hospital, at a birthing center, or at home.  If you have children, make plans to have someone take care of them when you go to the hospital or birthing center.  Know the distance and the time it takes to go to the hospital or birthing center. Practice going there and time it to be sure.  Have a bag packed with a nightgown, bathrobe, and toiletries. Be ready to take it with you when you go into labor.  Keep phone numbers of your family and friends handy if you need to call someone when you go into labor.  Your spouse or partner should go to all the natural childbirth technique classes.  Talk with your health care provider about the possibility of a medical emergency and what will happen if that occurs. Advantages of natural childbirth  You are in control of your labor and delivery.  You will not take medicines that could affect you and the baby.  There are no invasive procedures, such as an episiotomy.  You and your spouse or partner will work together, which can increase your bond with each other.  In most delivery centers, your family and friends can be involved in the labor and delivery process. Disadvantages of natural childbirth  You will experience pain during your labor and delivery.  The methods of helping relieve your labor pains may not work for you.  You may feel disappointed if you decide to change your mind during labor and not   have a natural childbirth. After the delivery  You will be very tired.  You will be uncomfortable because of your uterus contracting. You will feel soreness around the vagina.  You may feel cold and shaky. This is a natural reaction. This information is not intended to replace advice given to you by your health care provider. Make sure you discuss any questions you  have with your health care provider. Document Released: 06/02/2008 Document Revised: 11/26/2015 Document Reviewed: 02/25/2013 Elsevier Interactive Patient Education  2017 Elsevier Inc.  

## 2018-01-17 NOTE — Progress Notes (Signed)
   PRENATAL VISIT NOTE  Subjective:  Joy [name] is a 27 y.o. G1P0 at 24w1dbeing seen today for ongoing prenatal care.  She is currently monitored for the following issues for this low-risk pregnancy and has Supervision of normal first pregnancy, antepartum; Rh negative state in antepartum period; and Rubella non-immune status, antepartum on their problem list.  Patient reports backache.  Contractions: Not present. Vag. Bleeding: None.  Movement: Present. Denies leaking of fluid.   The following portions of the patient's history were reviewed and updated as appropriate: allergies, current medications, past family history, past medical history, past social history, past surgical history and problem list. Problem list updated.  Objective:   Vitals:   01/17/18 1555  BP: 114/69  Pulse: (!) 106  Weight: 133 lb 1.9 oz (60.4 kg)    Fetal Status:     Movement: Present     General:  Alert, oriented and cooperative. Patient is in no acute distress.  Skin: Skin is warm and dry. No rash noted.   Cardiovascular: Normal heart rate noted  Respiratory: Normal respiratory effort, no problems with respiration noted  Abdomen: Soft, gravid, appropriate for gestational age.  Pain/Pressure: Present     Pelvic: Cervical exam deferred        Extremities: Normal range of motion.  Edema: Trace  Mental Status: Normal mood and affect. Normal behavior. Normal judgment and thought content.   Assessment and Plan:  Pregnancy: G1P0 at 324w1d1. Supervision of normal first pregnancy, antepartum 36 week cx next visit.   2. Rubella non-immune status, antepartum Needs MMR PP  3. Rh negative state in antepartum period Pt is s/p Rhogam   Preterm labor symptoms and general obstetric precautions including but not limited to vaginal bleeding, contractions, leaking of fluid and fetal movement were reviewed in detail with the patient. Please refer to After Visit Summary for other counseling recommendations.  Return  in about 1 week (around 01/24/2018).  No future appointments.  CaLavonia DraftsMD

## 2018-01-25 ENCOUNTER — Encounter: Payer: Medicaid Other | Admitting: Obstetrics & Gynecology

## 2018-01-25 ENCOUNTER — Ambulatory Visit (INDEPENDENT_AMBULATORY_CARE_PROVIDER_SITE_OTHER): Payer: Medicaid Other | Admitting: Obstetrics & Gynecology

## 2018-01-25 ENCOUNTER — Other Ambulatory Visit (HOSPITAL_COMMUNITY)
Admission: RE | Admit: 2018-01-25 | Discharge: 2018-01-25 | Disposition: A | Discharge status: Home / Self Care | Payer: Medicaid Other | Source: Ambulatory Visit | Attending: Obstetrics & Gynecology | Admitting: Obstetrics & Gynecology

## 2018-01-25 VITALS — BP 122/67 | HR 102 | Wt 135.0 lb

## 2018-01-25 DIAGNOSIS — O26899 Other specified pregnancy related conditions, unspecified trimester: Secondary | ICD-10-CM

## 2018-01-25 DIAGNOSIS — Z3400 Encounter for supervision of normal first pregnancy, unspecified trimester: Secondary | ICD-10-CM

## 2018-01-25 DIAGNOSIS — Z283 Underimmunization status: Secondary | ICD-10-CM

## 2018-01-25 DIAGNOSIS — N898 Other specified noninflammatory disorders of vagina: Secondary | ICD-10-CM | POA: Diagnosis not present

## 2018-01-25 DIAGNOSIS — O26893 Other specified pregnancy related conditions, third trimester: Secondary | ICD-10-CM | POA: Diagnosis present

## 2018-01-25 DIAGNOSIS — Z6791 Unspecified blood type, Rh negative: Secondary | ICD-10-CM

## 2018-01-25 DIAGNOSIS — O9989 Other specified diseases and conditions complicating pregnancy, childbirth and the puerperium: Secondary | ICD-10-CM

## 2018-01-25 DIAGNOSIS — Z3490 Encounter for supervision of normal pregnancy, unspecified, unspecified trimester: Secondary | ICD-10-CM

## 2018-01-25 DIAGNOSIS — J309 Allergic rhinitis, unspecified: Secondary | ICD-10-CM | POA: Insufficient documentation

## 2018-01-25 DIAGNOSIS — B373 Candidiasis of vulva and vagina: Secondary | ICD-10-CM

## 2018-01-25 DIAGNOSIS — Z3A36 36 weeks gestation of pregnancy: Secondary | ICD-10-CM | POA: Diagnosis not present

## 2018-01-25 DIAGNOSIS — J3089 Other allergic rhinitis: Secondary | ICD-10-CM

## 2018-01-25 DIAGNOSIS — B3731 Acute candidiasis of vulva and vagina: Secondary | ICD-10-CM

## 2018-01-25 DIAGNOSIS — O09899 Supervision of other high risk pregnancies, unspecified trimester: Secondary | ICD-10-CM

## 2018-01-25 MED ORDER — FLUCONAZOLE 150 MG PO TABS: 150.0000 mg | ORAL_TABLET | Freq: Once | ORAL | 3 refills | Status: AC

## 2018-01-25 NOTE — Progress Notes (Signed)
   PRENATAL VISIT NOTE  Subjective:  Joy [name] is a 27 y.o. G1P0 at 9550w2d being seen today for ongoing prenatal care.  She is currently monitored for the following issues for this low-risk pregnancy and has Supervision of normal first pregnancy, antepartum; Rh negative state in antepartum period; and Rubella non-immune status, antepartum on their problem list.  Patient reports occasional contractions.  Contractions: Irritability. Vag. Bleeding: None.  Movement: Present. Denies leaking of fluid.   The following portions of the patient's history were reviewed and updated as appropriate: allergies, current medications, past family history, past medical history, past social history, past surgical history and problem list. Problem list updated.  Objective:   Vitals:   01/25/18 0823  BP: 122/67  Pulse: (!) 102  Weight: 135 lb 0.6 oz (61.3 kg)    Fetal Status: Fetal Heart Rate (bpm): 133   Movement: Present     General:  Alert, oriented and cooperative. Patient is in no acute distress.  Skin: Skin is warm and dry. No rash noted.   Cardiovascular: Normal heart rate noted  Respiratory: Normal respiratory effort, no problems with respiration noted  Abdomen: Soft, gravid, appropriate for gestational age.  Pain/Pressure: Present     Pelvic: Cervical exam performed        Extremities: Normal range of motion.  Edema: Trace  Mental Status: Normal mood and affect. Normal behavior. Normal judgment and thought content.   Assessment and Plan:  Pregnancy: G1P0 at 1250w2d  1. Prenatal care, antepartum  - GC[facility]Chlam[facility] - Culture, beta strep (group b only)  2. Supervision of normal first pregnancy, antepartum  3. Rubella non-immune status, antepartum  4. Rh negative state in antepartum period Needs Rhogam PP  5. Abnormal 1 hour GTT Needs 3 hour GTT. Pt will come in tomorrow fasting.  Her HgbA1C was WNL    6. Yeast vaginitis Pt has used vaginal cream x 2  week with no resolution of sx. D/w pt oral meds.  Diflucan 150mg  po x 1  7. Allergic rhinitis Flonase 2 puffs in each nostril daily   Preterm labor symptoms and general obstetric precautions including but not limited to vaginal bleeding, contractions, leaking of fluid and fetal movement were reviewed in detail with the patient. Please refer to After Visit Summary for other counseling recommendations.  Return in about 1 week (around 02/01/2018).  No future appointments.  Willodean Rosenthalarolyn Harraway-Smith, MD

## 2018-01-25 NOTE — Patient Instructions (Addendum)
Glucose Tolerance Test During Pregnancy The glucose tolerance test (GTT) is a blood test used to determine if you have developed a type of diabetes during pregnancy (gestational diabetes). This is when your body does not properly process sugar (glucose) in the food you eat, resulting in high blood glucose levels. Typically, a GTT is done after you have had a 1-hour glucose test with results that indicate you possibly have gestational diabetes. It may also be done if:  You have a history of giving birth to very large babies or have experienced repeated fetal loss (stillbirth).  You have signs and symptoms of diabetes, such as: ? Changes in your vision. ? Tingling or numbness in your hands or feet. ? Changes in hunger, thirst, and urination not otherwise explained by your pregnancy.  The GTT lasts about 3 hours. You will be given a sugar-water solution to drink at the beginning of the test. You will have blood drawn before you drink the solution and then again 1, 2, and 3 hours after you drink it. You will not be allowed to eat or drink anything else during the test. You must remain at the testing location to make sure that your blood is drawn on time. You should also avoid exercising during the test, because exercise can alter test results. How do I prepare for this test? Eat normally for 3 days prior to the GTT test, including having plenty of carbohydrate-rich foods. Do not eat or drink anything except water during the final 12 hours before the test. In addition, your health care provider may ask you to stop taking certain medicines before the test. What do the results mean? It is your responsibility to obtain your test results. Ask the lab or department performing the test when and how you will get your results. Contact your health care provider to discuss any questions you have about your results. Range of Normal Values Ranges for normal values may vary among different labs and hospitals. You  should always check with your health care provider after having lab work or other tests done to discuss whether your values are considered within normal limits. Normal levels of blood glucose are as follows:  Fasting: less than 105 mg/dL.  1 hour after drinking the solution: less than 190 mg/dL.  2 hours after drinking the solution: less than 165 mg/dL.  3 hours after drinking the solution: less than 145 mg/dL.  Some substances can interfere with GTT results. These may include:  Blood pressure and heart failure medicines, including beta blockers, furosemide, and thiazides.  Anti-inflammatory medicines, including aspirin.  Nicotine.  Some psychiatric medicines.  Meaning of Results Outside Normal Value Ranges GTT test results that are above normal values may indicate a number of health problems, such as:  Gestational diabetes.  Acute stress response.  Cushing syndrome.  Tumors such as pheochromocytoma or glucagonoma.  Long-term kidney problems.  Pancreatitis.  Hyperthyroidism.  Current infection.  Discuss your test results with your health care provider. He or she will use the results to make a diagnosis and determine a treatment plan that is right for you. This information is not intended to replace advice given to you by your health care provider. Make sure you discuss any questions you have with your health care provider. Document Released: 12/20/2011 Document Revised: 11/26/2015 Document Reviewed: 10/25/2013 Elsevier Interactive Patient Education  [employer]2018 Elsevier Inc.   The meds for nasal congestion is Flonase. 2 puff in each nostril daily.

## 2018-01-26 ENCOUNTER — Other Ambulatory Visit: Payer: Medicaid Other

## 2018-01-26 DIAGNOSIS — Z3400 Encounter for supervision of normal first pregnancy, unspecified trimester: Secondary | ICD-10-CM

## 2018-01-26 DIAGNOSIS — O24419 Gestational diabetes mellitus in pregnancy, unspecified control: Secondary | ICD-10-CM

## 2018-01-26 LAB — CERVICOVAGINAL ANCILLARY ONLY
Bacterial vaginitis: NEGATIVE
Candida vaginitis: POSITIVE — AB
Chlamydia: NEGATIVE
NEISSERIA GONORRHEA: NEGATIVE

## 2018-01-27 LAB — GLUCOSE TOLERANCE, 2 HOURS W/ 1HR
Glucose, 1 hour: 208 mg/dL — ABNORMAL HIGH (ref 65–179)
Glucose, 2 hour: 173 mg/dL — ABNORMAL HIGH (ref 65–152)
Glucose, Fasting: 102 mg/dL — ABNORMAL HIGH (ref 65–91)

## 2018-01-28 LAB — CULTURE, BETA STREP (GROUP B ONLY): Strep Gp B Culture: POSITIVE — AB

## 2018-01-29 ENCOUNTER — Telehealth: Payer: Self-pay

## 2018-01-29 ENCOUNTER — Encounter: Payer: Self-pay | Admitting: Obstetrics & Gynecology

## 2018-01-29 DIAGNOSIS — R7309 Other abnormal glucose: Secondary | ICD-10-CM

## 2018-01-29 DIAGNOSIS — O99820 Streptococcus B carrier state complicating pregnancy: Secondary | ICD-10-CM | POA: Insufficient documentation

## 2018-01-29 DIAGNOSIS — O24419 Gestational diabetes mellitus in pregnancy, unspecified control: Secondary | ICD-10-CM | POA: Insufficient documentation

## 2018-01-29 NOTE — Telephone Encounter (Signed)
Patient called and made aware that she does have gestational diabetes and that nutrition and diabetes management will be calling her with an appointment for diabetes education. Patient states understanding. (urgent referral placed). Armandina StammerJennifer Renley Banwart RN

## 2018-01-31 ENCOUNTER — Encounter: Payer: Medicaid Other | Attending: Obstetrics & Gynecology | Admitting: Registered"

## 2018-01-31 ENCOUNTER — Encounter: Payer: Self-pay | Admitting: Registered"

## 2018-01-31 DIAGNOSIS — Z3A36 36 weeks gestation of pregnancy: Secondary | ICD-10-CM | POA: Diagnosis not present

## 2018-01-31 DIAGNOSIS — O99810 Abnormal glucose complicating pregnancy: Secondary | ICD-10-CM | POA: Diagnosis not present

## 2018-01-31 DIAGNOSIS — O24419 Gestational diabetes mellitus in pregnancy, unspecified control: Secondary | ICD-10-CM

## 2018-01-31 DIAGNOSIS — Z713 Dietary counseling and surveillance: Secondary | ICD-10-CM | POA: Diagnosis present

## 2018-01-31 NOTE — Progress Notes (Signed)
Patient was seen on 01/31/2018 for Gestational Diabetes self-management class at the [facility]. The following learning objectives were met by the patient during this course:   States the definition of Gestational Diabetes  States why dietary management is important in controlling blood glucose  Describes the effects each nutrient has on blood glucose levels  Demonstrates ability to create a balanced meal plan  Demonstrates carbohydrate counting   States when to check blood glucose levels  Demonstrates proper blood glucose monitoring techniques  States the effect of stress and exercise on blood glucose levels  States the importance of limiting caffeine and abstaining from alcohol and smoking  Blood glucose monitor given: Accu-chek Guide Lot # [id number] Exp: 10/17/18 Blood glucose reading: 112  Patient instructed to monitor glucose levels: FBS: 60 - <95; 1 hour: <140; 2 hour: <120  Patient received handouts:  Nutrition Diabetes and Pregnancy, including carb counting list  Patient will be seen for follow-up as needed.

## 2018-02-02 ENCOUNTER — Ambulatory Visit (INDEPENDENT_AMBULATORY_CARE_PROVIDER_SITE_OTHER): Payer: Medicaid Other | Admitting: Family Medicine

## 2018-02-02 VITALS — BP 108/69 | HR 100 | Wt 138.0 lb

## 2018-02-02 DIAGNOSIS — Z3400 Encounter for supervision of normal first pregnancy, unspecified trimester: Secondary | ICD-10-CM

## 2018-02-02 DIAGNOSIS — O24410 Gestational diabetes mellitus in pregnancy, diet controlled: Secondary | ICD-10-CM

## 2018-02-02 DIAGNOSIS — O24419 Gestational diabetes mellitus in pregnancy, unspecified control: Secondary | ICD-10-CM

## 2018-02-02 MED ORDER — GLUCOSE BLOOD VI STRP: ORAL_STRIP | 12 refills | Status: DC

## 2018-02-02 MED ORDER — ACCU-CHEK FASTCLIX LANCETS MISC: 1.0000 | Freq: Four times a day (QID) | 12 refills | Status: DC

## 2018-02-02 MED ORDER — ACCU-CHEK AVIVA PLUS W/DEVICE KIT: 1.0000 | PACK | Freq: Four times a day (QID) | 0 refills | Status: DC

## 2018-02-02 NOTE — Patient Instructions (Addendum)
BENEFITS OF BREASTFEEDING Many women wonder if they should breastfeed. Research shows that breast milk contains the perfect balance of vitamins, protein and fat that your baby needs to grow. It also contains antibodies that help your baby's immune system to fight off viruses and bacteria and can reduce the risk of sudden infant death syndrome (SIDS). In addition, the colostrum (a fluid secreted from the breast in the first few days after delivery) helps your newborn's digestive system to grow and function well. Breast milk is easier to digest than formula. Also, if your baby is born preterm, breast milk can help to reduce both short- and long-term health problems. BENEFITS OF BREASTFEEDING FOR MOM . Breastfeeding causes a hormone to be released that helps the uterus to contract and return to its normal size more quickly. . It aids in postpartum weight loss, reduces risk of breast and ovarian cancer, heart disease and rheumatoid arthritis. . It decreases the amount of bleeding after the baby is born. benefits of breastfeeding for baby . Provides comfort and nutrition . Protects baby against - Obesity - Diabetes - Asthma - Childhood cancers - Heart disease - Ear infections - Diarrhea - Pneumonia - Stomach problems - Serious allergies - Skin rashes . Promotes growth and development . Reduces the risk of baby having Sudden Infant Death Syndrome (SIDS) only breastmilk for the first 6 months . Protects baby against diseases/allergies . It's the perfect amount for tiny bellies . It restores baby's energy . Provides the best nutrition for baby . Giving water or formula can make baby more likely to get sick, decrease Mom's milk supply, make baby less content with breastfeeding Skin to Skin After delivery, the staff will place your baby on your chest. This helps with the following: . Regulates baby's temperature, breathing, heart rate and blood sugar . Increases Mom's milk supply . Promotes  bonding . Keeps baby and Mom calm and decreases baby's crying Rooming In Your baby will stay in your room with you for the entire time you are in the hospital. This helps with the following: . Allows Mom to learn baby's feeding cues - Fluttering eyes - Sucking on tongue or hand - Rooting (opens mouth and turns head) - Nuzzling into the breast - Bringing hand to mouth . Allows breastfeeding on demand (when your baby is ready) . Helps baby to be calm and content . Ensures a good milk supply . Prevents complications with breastfeeding . Allows parents to learn to care for baby . Allows you to request assistance with breastfeeding Importance of a good latch . Increases milk transfer to baby - baby gets enough milk . Ensures you have enough milk for your baby . Decreases nipple soreness . Don't use pacifiers and bottles - these cause baby to suck differently than breastfeeding . Promotes continuation of breastfeeding Risks of Formula Supplementation with Breastfeeding Giving your infant formula in addition to your breast-milk EXCEPT when medically necessary can lead to: . Decreases your milk supply  . Loss of confidence in yourself for providing baby's nutrition  . Engorgement and possibly mastitis  . Asthma & allergies in the baby BREASTFEEDING FAQS How long should I breastfeed my baby? It is recommended that you provide your baby with breast milk only for the first 6 months and then continue for the first year and longer as desired. During the first few weeks after birth, your baby will need to feed 8-12 times every 24 hours, or every 2-3 hours. They will likely feed   for 15-30 minutes. How can I help my baby begin breastfeeding? Babies are born with an instinct to breastfeed. A healthy baby can begin breastfeeding right away without specific help. At the hospital, a nurse (or lactation consultant) will help you begin the process and will give you tips on good positioning. It may be  helpful to take a breastfeeding class before you deliver in order to know what to expect. How can I help my baby latch on? In order to assist your baby in latching-on, cup your breast in your hand and stroke your baby's lower lip with your nipple to stimulate your baby's rooting reflex. Your baby will look like he or she is yawning, at which point you should bring the baby towards your breast, while aiming the nipple at the roof of his or her mouth. Remember to bring the baby towards you and not your breast towards the baby. How can I tell if my baby is latched-on? Your baby will have all of your nipple and part of the dark area around the nipple in his or her mouth and your baby's nose will be touching your breast. You should see or hear the baby swallowing. If the baby is not latched-on properly, start the process over. To remove the suction, insert a clean finger between your breast and the baby's mouth. Should I switch breasts during feeding? After feeding on one side, switch the baby to your other breast. If he or she does not continue feeding - that is OK. Your baby will not necessarily need to feed from both breasts in a single feeding. On the next feeding, start with the other breast for efficiency and comfort. How can I tell if my baby is hungry? When your baby is hungry, they will nuzzle against your breast, make sucking noises and tongue motions and may put their hands near their mouth. Crying is a late sign of hunger, so you should not wait until this point. When they have received enough milk, they will unlatch from the breast. Is it okay to use a pacifier? Until your baby gets the hang of breastfeeding, experts recommend limiting pacifier usage. If you have questions about this, please contact your pediatrician. What can I do to ensure proper nutrition while breastfeeding? . Make sure that you support your own health and your baby's by eating a healthy, well-balanced diet . Your provider  may recommend that you continue to take your prenatal vitamin . Drink plenty of fluids. It is a good rule to drink one glass of water before or after feeding . Alcohol will remain in the breast milk for as long as it will remain in the blood stream. If you choose to have a drink, it is recommended that you wait at least 2 hours before feeding . Moderate amounts of caffeine are OK . Some over-the-counter or prescription medications are not recommended during breastfeeding. Check with your provider if you have questions What types of birth control methods are safe while breastfeeding? Progestin-only methods, including a daily pill, an IUD, the implant and the injection are safe while breastfeeding. Methods that contain estrogen (such as combination birth control pills, the vaginal ring and the patch) should not be used during the first month of breastfeeding as these can decrease your milk supply.   Gestational Diabetes Mellitus, Diagnosis Gestational diabetes (gestational diabetes mellitus) is a temporary form of diabetes that some women develop during pregnancy. It usually occurs around weeks 24-28 of pregnancy and goes away after  delivery. Hormonal changes during pregnancy can interfere with insulin production and function, which may result in one or both of these problems:  The pancreas does not make enough of a hormone called insulin.  Cells in the body do not respond properly to insulin that the body makes (insulin resistance).  Normally, insulin allows sugars (glucose) to enter cells in the body. The cells use glucose for energy. Insulin resistance or lack of insulin causes excess glucose to build up in the blood instead of going into cells. As a result, high blood glucose (hyperglycemia) develops. What are the risks? If gestational diabetes is treated, it is unlikely to cause problems. If it is not controlled with treatment, it may cause problems during labor and delivery, and some of those  problems can be harmful to the unborn baby (fetus) and the mother. Uncontrolled gestational diabetes may also cause the newborn baby to have breathing problems and low blood glucose. Women who get gestational diabetes are more likely to develop it if they get pregnant again, and they are more likely to develop type 2 diabetes in the future. What increases the risk? This condition may be more likely to develop in pregnant women who:  Are older than age 80 during pregnancy.  Have a family history of diabetes.  Are overweight.  Had gestational diabetes in the past.  Have polycystic ovarian syndrome (PCOS).  Are pregnant with twins or multiples.  Are of American-Indian, African-American, Hispanic/Latino, or Asian/Pacific Islander descent.  What are the signs or symptoms? Most women do not notice symptoms of gestational diabetes because the symptoms are similar to normal symptoms of pregnancy. Symptoms of gestational diabetes may include:  Increased thirst (polydipsia).  Increased hunger(polyphagia).  Increased urination (polyuria).  How is this diagnosed?  This condition may be diagnosed based on your blood glucose level, which may be checked with one or more of the following blood tests:  A fasting blood glucose (FBG) test. You will not be allowed to eat (you will fast) for at least 8 hours before a blood sample is taken.  A random blood glucose test. This checks your blood glucose at any time of day regardless of when you ate.  An oral glucose tolerance test (OGTT). This is usually done during weeks 24-28 of pregnancy. ? For this test, you will have an FBG test done. Then, you will drink a beverage that contains glucose. Your blood glucose will be tested again 1 hour after drinking the glucose beverage (1-hour OGTT). ? If the 1-hour OGTT result is at or above 140 mg/dL (7.8 mmol/L), you will repeat the OGTT. This time, your blood glucose will be tested 3 hours after drinking the  glucose beverage (3-hour OGTT).  If you have risk factors, you may be screened for undiagnosed type 2 diabetes at your first health care visit during your pregnancy (prenatal visit). How is this treated?  Your treatment may be managed by a specialist called an endocrinologist. This condition is treated by following instructions from your health care provider about:  Eating a healthier diet and getting more physical activity. These changes are the most important ways to manage gestational diabetes.  Checking your blood glucose. Do this as often as told.  Taking diabetes medicines or insulin every day. These will only be prescribed if they are needed. ? If you use insulin, you may need to adjust your dosage based on how physically active you are and what foods you eat. Your health care provider will tell you  how to do this.  Your health care provider will set treatment goals for you based on the stage of your pregnancy and any other medical conditions you have. Generally, the goal of treatment is to maintain the following blood glucose levels during pregnancy:  Fasting: at or below 95 mg/dL (5.3 mmol/L).  After meals (postprandial): ? One hour after a meal: at or below 140 mg/dL (7.8 mmol/L). ? Two hours after a meal: at or below 120 mg/dL (6.7 mmol/L).  A1c (hemoglobin A1c) level: 6-6.5%.  Follow these instructions at home:  Take over-the-counter and prescription medicines only as told by your health care provider.  Manage your weight gain during pregnancy. The amount of weight that you are expected to gain depends on your pre-pregnancy BMI (body mass index).  Keep all follow-up visits as told by your health care provider. This is important. Consider asking your health care provider these questions:   Do I need to meet with a diabetes educator?  Where can I find a support group for people with diabetes?  What equipment will I need to manage my diabetes at home?  What diabetes  medicines do I need, and when should I take them?  How often do I need to check my blood glucose?  What number can I call if I have questions?  When is my next appointment? Where to find more information:  For more information about diabetes, visit: ? American Diabetes Association (ADA): www.diabetes.org ? American Association of Diabetes Educators (AADE): www.diabeteseducator.org/patient-resources Contact a health care provider if:  Your blood glucose level is at or above 240 mg/dL (16.1 mmol/L).  Your blood glucose level is at or above 200 mg/dL (09.6 mmol/L) and you have ketones in your urine.  You have been sick or have had a fever for 2 days or more and you are not getting better.  You have any of the following problems for more than 6 hours: ? You cannot eat or drink. ? You have nausea and vomiting. ? You have diarrhea. Get help right away if:  Your blood glucose is below 54 mg/dL (3 mmol/L).  You become confused or you have trouble thinking clearly.  You have difficulty breathing.  You have moderate or large ketone levels in your urine.  Your baby is moving around less than usual.  You develop unusual discharge or bleeding from your vagina.  You start having contractions early (prematurely). Contractions may feel like a tightening in your lower abdomen. This information is not intended to replace advice given to you by your health care provider. Make sure you discuss any questions you have with your health care provider. Document Released: 09/26/2000 Document Revised: 11/26/2015 Document Reviewed: 07/24/2015 Elsevier Interactive Patient Education  2018 [employer].   Breastfeeding Choosing to breastfeed is one of the best decisions you can make for yourself and your baby. A change in hormones during pregnancy causes your breasts to make breast milk in your milk-producing glands. Hormones prevent breast milk from being released before your baby is born. They also  prompt milk flow after birth. Once breastfeeding has begun, thoughts of your baby, as well as his or her sucking or crying, can stimulate the release of milk from your milk-producing glands. Benefits of breastfeeding Research shows that breastfeeding offers many health benefits for infants and mothers. It also offers a cost-free and convenient way to feed your baby. For your baby  Your first milk (colostrum) helps your baby's digestive system to function better.  Special  cells in your milk (antibodies) help your baby to fight off infections.  Breastfed babies are less likely to develop asthma, allergies, obesity, or type 2 diabetes. They are also at lower risk for sudden infant death syndrome (SIDS).  Nutrients in breast milk are better able to meet your baby's needs compared to infant formula.  Breast milk improves your baby's brain development. For you  Breastfeeding helps to create a very special bond between you and your baby.  Breastfeeding is convenient. Breast milk costs nothing and is always available at the correct temperature.  Breastfeeding helps to burn calories. It helps you to lose the weight that you gained during pregnancy.  Breastfeeding makes your uterus return faster to its size before pregnancy. It also slows bleeding (lochia) after you give birth.  Breastfeeding helps to lower your risk of developing type 2 diabetes, osteoporosis, rheumatoid arthritis, cardiovascular disease, and breast, ovarian, uterine, and endometrial cancer later in life. Breastfeeding basics Starting breastfeeding  Find a comfortable place to sit or lie down, with your neck and back well-supported.  Place a pillow or a rolled-up blanket under your baby to bring him or her to the level of your breast (if you are seated). Nursing pillows are specially designed to help support your arms and your baby while you breastfeed.  Make sure that your baby's tummy (abdomen) is facing your  abdomen.  Gently massage your breast. With your fingertips, massage from the outer edges of your breast inward toward the nipple. This encourages milk flow. If your milk flows slowly, you may need to continue this action during the feeding.  Support your breast with 4 fingers underneath and your thumb above your nipple (make the letter "C" with your hand). Make sure your fingers are well away from your nipple and your baby's mouth.  Stroke your baby's lips gently with your finger or nipple.  When your baby's mouth is open wide enough, quickly bring your baby to your breast, placing your entire nipple and as much of the areola as possible into your baby's mouth. The areola is the colored area around your nipple. ? More areola should be visible above your baby's upper lip than below the lower lip. ? Your baby's lips should be opened and extended outward (flanged) to ensure an adequate, comfortable latch. ? Your baby's tongue should be between his or her lower gum and your breast.  Make sure that your baby's mouth is correctly positioned around your nipple (latched). Your baby's lips should create a seal on your breast and be turned out (everted).  It is common for your baby to suck about 2-3 minutes in order to start the flow of breast milk. Latching Teaching your baby how to latch onto your breast properly is very important. An improper latch can cause nipple pain, decreased milk supply, and poor weight gain in your baby. Also, if your baby is not latched onto your nipple properly, he or she may swallow some air during feeding. This can make your baby fussy. Burping your baby when you switch breasts during the feeding can help to get rid of the air. However, teaching your baby to latch on properly is still the best way to prevent fussiness from swallowing air while breastfeeding. Signs that your baby has successfully latched onto your nipple  Silent tugging or silent sucking, without causing you  pain. Infant's lips should be extended outward (flanged).  Swallowing heard between every 3-4 sucks once your milk has started to flow (after  your let-down milk reflex occurs).  Muscle movement above and in front of his or her ears while sucking.  Signs that your baby has not successfully latched onto your nipple  Sucking sounds or smacking sounds from your baby while breastfeeding.  Nipple pain.  If you think your baby has not latched on correctly, slip your finger into the corner of your baby's mouth to break the suction and place it between your baby's gums. Attempt to start breastfeeding again. Signs of successful breastfeeding Signs from your baby  Your baby will gradually decrease the number of sucks or will completely stop sucking.  Your baby will fall asleep.  Your baby's body will relax.  Your baby will retain a small amount of milk in his or her mouth.  Your baby will let go of your breast by himself or herself.  Signs from you  Breasts that have increased in firmness, weight, and size 1-3 hours after feeding.  Breasts that are softer immediately after breastfeeding.  Increased milk volume, as well as a change in milk consistency and color by the fifth day of breastfeeding.  Nipples that are not sore, cracked, or bleeding.  Signs that your baby is getting enough milk  Wetting at least 1-2 diapers during the first 24 hours after birth.  Wetting at least 5-6 diapers every 24 hours for the first week after birth. The urine should be clear or pale yellow by the age of 5 days.  Wetting 6-8 diapers every 24 hours as your baby continues to grow and develop.  At least 3 stools in a 24-hour period by the age of 5 days. The stool should be soft and yellow.  At least 3 stools in a 24-hour period by the age of 7 days. The stool should be seedy and yellow.  No loss of weight greater than 10% of birth weight during the first 3 days of life.  Average weight gain of 4-7 oz  (113-198 g) per week after the age of 4 days.  Consistent daily weight gain by the age of 5 days, without weight loss after the age of 2 weeks. After a feeding, your baby may spit up a small amount of milk. This is normal. Breastfeeding frequency and duration Frequent feeding will help you make more milk and can prevent sore nipples and extremely full breasts (breast engorgement). Breastfeed when you feel the need to reduce the fullness of your breasts or when your baby shows signs of hunger. This is called "breastfeeding on demand." Signs that your baby is hungry include:  Increased alertness, activity, or restlessness.  Movement of the head from side to side.  Opening of the mouth when the corner of the mouth or cheek is stroked (rooting).  Increased sucking sounds, smacking lips, cooing, sighing, or squeaking.  Hand-to-mouth movements and sucking on fingers or hands.  Fussing or crying.  Avoid introducing a pacifier to your baby in the first 4-6 weeks after your baby is born. After this time, you may choose to use a pacifier. Research has shown that pacifier use during the first year of a baby's life decreases the risk of sudden infant death syndrome (SIDS). Allow your baby to feed on each breast as long as he or she wants. When your baby unlatches or falls asleep while feeding from the first breast, offer the second breast. Because newborns are often sleepy in the first few weeks of life, you may need to awaken your baby to get him or her  to feed. Breastfeeding times will vary from baby to baby. However, the following rules can serve as a guide to help you make sure that your baby is properly fed:  Newborns (babies 52 weeks of age or younger) may breastfeed every 1-3 hours.  Newborns should not go without breastfeeding for longer than 3 hours during the day or 5 hours during the night.  You should breastfeed your baby a minimum of 8 times in a 24-hour period.  Breast milk  pumping Pumping and storing breast milk allows you to make sure that your baby is exclusively fed your breast milk, even at times when you are unable to breastfeed. This is especially important if you go back to work while you are still breastfeeding, or if you are not able to be present during feedings. Your lactation consultant can help you find a method of pumping that works best for you and give you guidelines about how long it is safe to store breast milk. Caring for your breasts while you breastfeed Nipples can become dry, cracked, and sore while breastfeeding. The following recommendations can help keep your breasts moisturized and healthy:  Avoid using soap on your nipples.  Wear a supportive bra designed especially for nursing. Avoid wearing underwire-style bras or extremely tight bras (sports bras).  Air-dry your nipples for 3-4 minutes after each feeding.  Use only cotton bra pads to absorb leaked breast milk. Leaking of breast milk between feedings is normal.  Use lanolin on your nipples after breastfeeding. Lanolin helps to maintain your skin's normal moisture barrier. Pure lanolin is not harmful (not toxic) to your baby. You may also hand express a few drops of breast milk and gently massage that milk into your nipples and allow the milk to air-dry.  In the first few weeks after giving birth, some women experience breast engorgement. Engorgement can make your breasts feel heavy, warm, and tender to the touch. Engorgement peaks within 3-5 days after you give birth. The following recommendations can help to ease engorgement:  Completely empty your breasts while breastfeeding or pumping. You may want to start by applying warm, moist heat (in the shower or with warm, water-soaked hand towels) just before feeding or pumping. This increases circulation and helps the milk flow. If your baby does not completely empty your breasts while breastfeeding, pump any extra milk after he or she is  finished.  Apply ice packs to your breasts immediately after breastfeeding or pumping, unless this is too uncomfortable for you. To do this: ? Put ice in a plastic bag. ? Place a towel between your skin and the bag. ? Leave the ice on for 20 minutes, 2-3 times a day.  Make sure that your baby is latched on and positioned properly while breastfeeding.  If engorgement persists after 48 hours of following these recommendations, contact your health care provider or a Advertising copywriter. Overall health care recommendations while breastfeeding  Eat 3 healthy meals and 3 snacks every day. Well-nourished mothers who are breastfeeding need an additional 450-500 calories a day. You can meet this requirement by increasing the amount of a balanced diet that you eat.  Drink enough water to keep your urine pale yellow or clear.  Rest often, relax, and continue to take your prenatal vitamins to prevent fatigue, stress, and low vitamin and mineral levels in your body (nutrient deficiencies).  Do not use any products that contain nicotine or tobacco, such as cigarettes and e-cigarettes. Your baby may be harmed by chemicals  from cigarettes that pass into breast milk and exposure to secondhand smoke. If you need help quitting, ask your health care provider.  Avoid alcohol.  Do not use illegal drugs or marijuana.  Talk with your health care provider before taking any medicines. These include over-the-counter and prescription medicines as well as vitamins and herbal supplements. Some medicines that may be harmful to your baby can pass through breast milk.  It is possible to become pregnant while breastfeeding. If birth control is desired, ask your health care provider about options that will be safe while breastfeeding your baby. Where to find more information: [employer]: www.llli.org Contact a health care provider if:  You feel like you want to stop breastfeeding or have become  frustrated with breastfeeding.  Your nipples are cracked or bleeding.  Your breasts are red, tender, or warm.  You have: ? Painful breasts or nipples. ? A swollen area on either breast. ? A fever or chills. ? Nausea or vomiting. ? Drainage other than breast milk from your nipples.  Your breasts do not become full before feedings by the fifth day after you give birth.  You feel sad and depressed.  Your baby is: ? Too sleepy to eat well. ? Having trouble sleeping. ? More than 41 week old and wetting fewer than 6 diapers in a 24-hour period. ? Not gaining weight by 47 days of age.  Your baby has fewer than 3 stools in a 24-hour period.  Your baby's skin or the white parts of his or her eyes become yellow. Get help right away if:  Your baby is overly tired (lethargic) and does not want to wake up and feed.  Your baby develops an unexplained fever. Summary  Breastfeeding offers many health benefits for infant and mothers.  Try to breastfeed your infant when he or she shows early signs of hunger.  Gently tickle or stroke your baby's lips with your finger or nipple to allow the baby to open his or her mouth. Bring the baby to your breast. Make sure that much of the areola is in your baby's mouth. Offer one side and burp the baby before you offer the other side.  Talk with your health care provider or lactation consultant if you have questions or you face problems as you breastfeed. This information is not intended to replace advice given to you by your health care provider. Make sure you discuss any questions you have with your health care provider. Document Released: 06/20/2005 Document Revised: 07/22/2016 Document Reviewed: 07/22/2016 Elsevier Interactive Patient Education  [employer].

## 2018-02-02 NOTE — Progress Notes (Signed)
   PRENATAL VISIT NOTE  Subjective:  Joy [name] is a 27 y.o. G1P0 at 25w3dbeing seen today for ongoing prenatal care.  She is currently monitored for the following issues for this high-risk pregnancy and has Supervision of normal first pregnancy, antepartum; Rh negative state in antepartum period; Rubella non-immune status, antepartum; Allergic rhinitis; Yeast vaginitis; Group B Streptococcus carrier, antepartum; and Gestational diabetes mellitus (GDM) affecting pregnancy, antepartum on their problem list.  Patient reports no complaints.  Contractions: Not present. Vag. Bleeding: None.  Movement: Present. Denies leaking of fluid.   The following portions of the patient's history were reviewed and updated as appropriate: allergies, current medications, past family history, past medical history, past social history, past surgical history and problem list. Problem list updated.  Objective:   Vitals:   02/02/18 0819  BP: 108/69  Pulse: 100  Weight: 138 lb (62.6 kg)    Fetal Status: Fetal Heart Rate (bpm): 142   Movement: Present     General:  Alert, oriented and cooperative. Patient is in no acute distress.  Skin: Skin is warm and dry. No rash noted.   Cardiovascular: Normal heart rate noted  Respiratory: Normal respiratory effort, no problems with respiration noted  Abdomen: Soft, gravid, appropriate for gestational age.  Pain/Pressure: Present     Pelvic: Cervical exam deferred        Extremities: Normal range of motion.  Edema: Trace  Mental Status: Normal mood and affect. Normal behavior. Normal judgment and thought content.   Assessment and Plan:  Pregnancy: G1P0 at 332w3d1. Diet controlled gestational diabetes mellitus (GDM), antepartum - Blood Glucose Monitoring Suppl (ACCU-CHEK AVIVA PLUS) w/Device KIT; 1 Device by Does not apply route 4 (four) times daily. DX: 024.419 check BS QID.  Dispense: 1 kit; Refill: 0 - ACCU-CHEK FASTCLIX LANCETS MISC; 1 Device by Percutaneous route  4 (four) times daily. DX: 024.419 check BS QID.  Dispense: 100 each; Refill: 12 - glucose blood (ACCU-CHEK GUIDE) test strip; DX: 024.419 check BS QID.  Dispense: 100 each; Refill: 12 - [place] FOLLOW UP; Future  2. Gestational diabetes mellitus (GDM) affecting pregnancy, antepartum FBS 93-98 2 hour pp 113-179 Has only 2 days of CBGs, working on diet, will add exercise. If needs meds, IOL needs to happen at 39 wks, o/w IOL at 40. U/S for growth at 38 wks.  3. Supervision of normal first pregnancy, antepartum   Preterm labor symptoms and general obstetric precautions including but not limited to vaginal bleeding, contractions, leaking of fluid and fetal movement were reviewed in detail with the patient. Please refer to After Visit Summary for other counseling recommendations.  Return in about 1 week (around 02/09/2018).  Future Appointments  Date Time Provider De[place]8/03/2018  3:45 PM [place] [facility]  02/12/2018 11:00 AM [place] DraftsMD [facility] None  02/19/2018  8:45 AM [facility] DraftsMD [facility] None  02/26/2018  9:15 AM Ha[place] DraftsMD [facility] None    TaDonnamae JudeMD

## 2018-02-04 ENCOUNTER — Inpatient Hospital Stay (HOSPITAL_COMMUNITY)
Admission: AD | Admit: 2018-02-04 | Discharge: 2018-02-04 | Disposition: A | Discharge status: Home / Self Care | Payer: Medicaid Other | Source: Ambulatory Visit | Attending: Obstetrics and Gynecology | Admitting: Obstetrics and Gynecology

## 2018-02-04 ENCOUNTER — Encounter (HOSPITAL_COMMUNITY): Payer: Self-pay

## 2018-02-04 ENCOUNTER — Other Ambulatory Visit: Payer: Self-pay

## 2018-02-04 DIAGNOSIS — O99820 Streptococcus B carrier state complicating pregnancy: Secondary | ICD-10-CM

## 2018-02-04 DIAGNOSIS — O479 False labor, unspecified: Secondary | ICD-10-CM

## 2018-02-04 DIAGNOSIS — O26899 Other specified pregnancy related conditions, unspecified trimester: Secondary | ICD-10-CM

## 2018-02-04 DIAGNOSIS — O4703 False labor before 37 completed weeks of gestation, third trimester: Secondary | ICD-10-CM | POA: Diagnosis not present

## 2018-02-04 DIAGNOSIS — Z3400 Encounter for supervision of normal first pregnancy, unspecified trimester: Secondary | ICD-10-CM

## 2018-02-04 DIAGNOSIS — Z283 Underimmunization status: Secondary | ICD-10-CM

## 2018-02-04 DIAGNOSIS — O9989 Other specified diseases and conditions complicating pregnancy, childbirth and the puerperium: Secondary | ICD-10-CM

## 2018-02-04 DIAGNOSIS — J3089 Other allergic rhinitis: Secondary | ICD-10-CM

## 2018-02-04 DIAGNOSIS — B3731 Acute candidiasis of vulva and vagina: Secondary | ICD-10-CM

## 2018-02-04 DIAGNOSIS — O24419 Gestational diabetes mellitus in pregnancy, unspecified control: Secondary | ICD-10-CM

## 2018-02-04 DIAGNOSIS — B373 Candidiasis of vulva and vagina: Secondary | ICD-10-CM

## 2018-02-04 DIAGNOSIS — O09899 Supervision of other high risk pregnancies, unspecified trimester: Secondary | ICD-10-CM

## 2018-02-04 DIAGNOSIS — Z6791 Unspecified blood type, Rh negative: Secondary | ICD-10-CM

## 2018-02-04 NOTE — Discharge Instructions (Signed)
Braxton Hicks Contractions °Contractions of the uterus can occur throughout pregnancy, but they are not always a sign that you are in labor. You may have practice contractions called Braxton Hicks contractions. These false labor contractions are sometimes confused with true labor. °What are Braxton Hicks contractions? °Braxton Hicks contractions are tightening movements that occur in the muscles of the uterus before labor. Unlike true labor contractions, these contractions do not result in opening (dilation) and thinning of the cervix. Toward the end of pregnancy (32-34 weeks), Braxton Hicks contractions can happen more often and may become stronger. These contractions are sometimes difficult to tell apart from true labor because they can be very uncomfortable. You should not feel embarrassed if you go to the hospital with false labor. °Sometimes, the only way to tell if you are in true labor is for your health care provider to look for changes in the cervix. The health care provider will do a physical exam and may monitor your contractions. If you are not in true labor, the exam should show that your cervix is not dilating and your water has not broken. °If there are other health problems associated with your pregnancy, it is completely safe for you to be sent home with false labor. You may continue to have Braxton Hicks contractions until you go into true labor. °How to tell the difference between true labor and false labor °True labor °· Contractions last 30-70 seconds. °· Contractions become very regular. °· Discomfort is usually felt in the top of the uterus, and it spreads to the lower abdomen and low back. °· Contractions do not go away with walking. °· Contractions usually become more intense and increase in frequency. °· The cervix dilates and gets thinner. °False labor °· Contractions are usually shorter and not as strong as true labor contractions. °· Contractions are usually irregular. °· Contractions  are often felt in the front of the lower abdomen and in the groin. °· Contractions may go away when you walk around or change positions while lying down. °· Contractions get weaker and are shorter-lasting as time goes on. °· The cervix usually does not dilate or become thin. °Follow these instructions at home: °· Take over-the-counter and prescription medicines only as told by your health care provider. °· Keep up with your usual exercises and follow other instructions from your health care provider. °· Eat and drink lightly if you think you are going into labor. °· If Braxton Hicks contractions are making you uncomfortable: °? Change your position from lying down or resting to walking, or change from walking to resting. °? Sit and rest in a tub of warm water. °? Drink enough fluid to keep your urine pale yellow. Dehydration may cause these contractions. °? Do slow and deep breathing several times an hour. °· Keep all follow-up prenatal visits as told by your health care provider. This is important. °Contact a health care provider if: °· You have a fever. °· You have continuous pain in your abdomen. °Get help right away if: °· Your contractions become stronger, more regular, and closer together. °· You have fluid leaking or gushing from your vagina. °· You pass blood-tinged mucus (bloody show). °· You have bleeding from your vagina. °· You have low back pain that you never had before. °· You feel your baby’s head pushing down and causing pelvic pressure. °· Your baby is not moving inside you as much as it used to. °Summary °· Contractions that occur before labor are called Braxton   Hicks contractions, false labor, or practice contractions. °· Braxton Hicks contractions are usually shorter, weaker, farther apart, and less regular than true labor contractions. True labor contractions usually become progressively stronger and regular and they become more frequent. °· Manage discomfort from Braxton Hicks contractions by  changing position, resting in a warm bath, drinking plenty of water, or practicing deep breathing. °This information is not intended to replace advice given to you by your health care provider. Make sure you discuss any questions you have with your health care provider. °Document Released: 11/03/2016 Document Revised: 11/03/2016 Document Reviewed: 11/03/2016 °Elsevier Interactive Patient Education © 2018 Elsevier Inc. ° °

## 2018-02-04 NOTE — MAU Note (Signed)
I have communicated with Earlene PlaterWallace, MD and reviewed vital signs:  Vitals:   02/04/18 0051 02/04/18 0134  BP: 129/70 123/67  Pulse: 94 94  Resp: 16 18  Temp: 98.1 F (36.7 C)   SpO2: 97%     Vaginal exam:  Dilation: Fingertip Effacement (%): Thick Cervical Position: Posterior Station: Ballotable Exam by:: Camelia Enganielle Simpson, RN,   Also reviewed contraction pattern and that non-stress test is reactive.  It has been documented that patient is contracting every 4-6 minutes with no cervical change since last visit not indicating active labor.  Patient denies any other complaints.  Based on this report provider has given order for discharge.  A discharge order and diagnosis entered by a provider.   Labor discharge instructions reviewed with patient.

## 2018-02-04 NOTE — MAU Note (Signed)
Pt. States ctx are irregular. Rates the pain 8/10. Denies bloody show or leaking. States good fetal movement.

## 2018-02-04 NOTE — MAU Note (Signed)
Pt here with c/o contractions every 15-30 minutes. Pt denies LOF or vaginal bleeding. Reports good fetal movement. Cervix closed last week in the office.

## 2018-02-06 ENCOUNTER — Encounter: Payer: Self-pay | Admitting: Obstetrics & Gynecology

## 2018-02-06 ENCOUNTER — Other Ambulatory Visit: Payer: Self-pay

## 2018-02-06 DIAGNOSIS — O24410 Gestational diabetes mellitus in pregnancy, diet controlled: Secondary | ICD-10-CM

## 2018-02-06 MED ORDER — GLUCOSE BLOOD VI STRP: ORAL_STRIP | 12 refills | Status: DC

## 2018-02-06 MED ORDER — ACCU-CHEK GUIDE W/DEVICE KIT: 1.0000 | PACK | 0 refills | Status: DC

## 2018-02-06 MED ORDER — ACCU-CHEK FASTCLIX LANCETS MISC: 1.0000 | Freq: Four times a day (QID) | 12 refills | Status: DC

## 2018-02-09 ENCOUNTER — Other Ambulatory Visit: Payer: Self-pay | Admitting: Family Medicine

## 2018-02-09 ENCOUNTER — Ambulatory Visit (HOSPITAL_COMMUNITY)
Admission: RE | Admit: 2018-02-09 | Discharge: 2018-02-09 | Disposition: A | Discharge status: Home / Self Care | Payer: Medicaid Other | Source: Ambulatory Visit | Attending: Family Medicine | Admitting: Family Medicine

## 2018-02-09 ENCOUNTER — Encounter (HOSPITAL_COMMUNITY): Payer: Self-pay

## 2018-02-09 DIAGNOSIS — Z6791 Unspecified blood type, Rh negative: Secondary | ICD-10-CM

## 2018-02-09 DIAGNOSIS — Z3A38 38 weeks gestation of pregnancy: Secondary | ICD-10-CM

## 2018-02-09 DIAGNOSIS — J3089 Other allergic rhinitis: Secondary | ICD-10-CM

## 2018-02-09 DIAGNOSIS — B3731 Acute candidiasis of vulva and vagina: Secondary | ICD-10-CM

## 2018-02-09 DIAGNOSIS — O24410 Gestational diabetes mellitus in pregnancy, diet controlled: Secondary | ICD-10-CM | POA: Diagnosis not present

## 2018-02-09 DIAGNOSIS — Z362 Encounter for other antenatal screening follow-up: Secondary | ICD-10-CM

## 2018-02-09 DIAGNOSIS — Z2839 Other underimmunization status: Secondary | ICD-10-CM

## 2018-02-09 DIAGNOSIS — O24419 Gestational diabetes mellitus in pregnancy, unspecified control: Secondary | ICD-10-CM

## 2018-02-09 DIAGNOSIS — O9989 Other specified diseases and conditions complicating pregnancy, childbirth and the puerperium: Secondary | ICD-10-CM

## 2018-02-09 DIAGNOSIS — Z283 Underimmunization status: Secondary | ICD-10-CM

## 2018-02-09 DIAGNOSIS — O26899 Other specified pregnancy related conditions, unspecified trimester: Secondary | ICD-10-CM

## 2018-02-09 DIAGNOSIS — Z3400 Encounter for supervision of normal first pregnancy, unspecified trimester: Secondary | ICD-10-CM

## 2018-02-09 DIAGNOSIS — O99820 Streptococcus B carrier state complicating pregnancy: Secondary | ICD-10-CM

## 2018-02-09 DIAGNOSIS — B373 Candidiasis of vulva and vagina: Secondary | ICD-10-CM

## 2018-02-12 ENCOUNTER — Ambulatory Visit (INDEPENDENT_AMBULATORY_CARE_PROVIDER_SITE_OTHER): Payer: Medicaid Other | Admitting: Obstetrics & Gynecology

## 2018-02-12 ENCOUNTER — Telehealth (HOSPITAL_COMMUNITY): Payer: Self-pay | Admitting: *Deleted

## 2018-02-12 ENCOUNTER — Encounter (HOSPITAL_COMMUNITY): Payer: Self-pay | Admitting: *Deleted

## 2018-02-12 VITALS — BP 113/81 | HR 97 | Wt 138.0 lb

## 2018-02-12 DIAGNOSIS — R310 Gross hematuria: Secondary | ICD-10-CM

## 2018-02-12 DIAGNOSIS — O26899 Other specified pregnancy related conditions, unspecified trimester: Secondary | ICD-10-CM | POA: Diagnosis not present

## 2018-02-12 DIAGNOSIS — Z2839 Other underimmunization status: Secondary | ICD-10-CM

## 2018-02-12 DIAGNOSIS — O9989 Other specified diseases and conditions complicating pregnancy, childbirth and the puerperium: Secondary | ICD-10-CM | POA: Diagnosis not present

## 2018-02-12 DIAGNOSIS — O99820 Streptococcus B carrier state complicating pregnancy: Secondary | ICD-10-CM

## 2018-02-12 DIAGNOSIS — Z6791 Unspecified blood type, Rh negative: Secondary | ICD-10-CM

## 2018-02-12 DIAGNOSIS — Z3403 Encounter for supervision of normal first pregnancy, third trimester: Secondary | ICD-10-CM | POA: Diagnosis not present

## 2018-02-12 DIAGNOSIS — Z3400 Encounter for supervision of normal first pregnancy, unspecified trimester: Secondary | ICD-10-CM

## 2018-02-12 DIAGNOSIS — O24419 Gestational diabetes mellitus in pregnancy, unspecified control: Secondary | ICD-10-CM

## 2018-02-12 DIAGNOSIS — Z283 Underimmunization status: Secondary | ICD-10-CM

## 2018-02-12 LAB — POCT URINALYSIS DIPSTICK OB
GLUCOSE, UA: NEGATIVE — AB
Nitrite, UA: NEGATIVE
SPEC GRAV UA: 1.02 (ref 1.010–1.025)
UROBILINOGEN UA: NEGATIVE U/dL — AB

## 2018-02-12 MED ORDER — GLYBURIDE 2.5 MG PO TABS: 2.5000 mg | ORAL_TABLET | Freq: Two times a day (BID) | ORAL | 0 refills | Status: DC

## 2018-02-12 MED ORDER — CEPHALEXIN 500 MG PO CAPS: 500.0000 mg | ORAL_CAPSULE | Freq: Two times a day (BID) | ORAL | 0 refills | Status: DC

## 2018-02-12 NOTE — Patient Instructions (Signed)
Labor Induction Labor induction is when steps are taken to cause a pregnant woman to begin the labor process. Most women go into labor on their own between 37 weeks and 42 weeks of the pregnancy. When this does not happen or when there is a medical need, methods may be used to induce labor. Labor induction causes a pregnant woman's uterus to contract. It also causes the cervix to soften (ripen), open (dilate), and thin out (efface). Usually, labor is not induced before 39 weeks of the pregnancy unless there is a problem with the baby or mother. Before inducing labor, your health care provider will consider a number of factors, including the following:  The medical condition of you and the baby.  How many weeks along you are.  The status of the baby's lung maturity.  The condition of the cervix.  The position of the baby. What are the reasons for labor induction? Labor may be induced for the following reasons:  The health of the baby or mother is at risk.  The pregnancy is overdue by 1 week or more.  The water breaks but labor does not start on its own.  The mother has a health condition or serious illness, such as high blood pressure, infection, placental abruption, or diabetes.  The amniotic fluid amounts are low around the baby.  The baby is distressed. Convenience or wanting the baby to be born on a certain date is not a reason for inducing labor. What methods are used for labor induction? Several methods of labor induction may be used, such as:  Prostaglandin medicine. This medicine causes the cervix to dilate and ripen. The medicine will also start contractions. It can be taken by mouth or by inserting a suppository into the vagina.  Inserting a thin tube (catheter) with a balloon on the end into the vagina to dilate the cervix. Once inserted, the balloon is expanded with water, which causes the cervix to open.  Stripping the membranes. Your health care provider separates  amniotic sac tissue from the cervix, causing the cervix to be stretched and causing the release of a hormone called progesterone. This may cause the uterus to contract. It is often done during an office visit. You will be sent home to wait for the contractions to begin. You will then come in for an induction.  Breaking the water. Your health care provider makes a hole in the amniotic sac using a small instrument. Once the amniotic sac breaks, contractions should begin. This may still take hours to see an effect.  Medicine to trigger or strengthen contractions. This medicine is given through an IV access tube inserted into a vein in your arm. All of the methods of induction, besides stripping the membranes, will be done in the hospital. Induction is done in the hospital so that you and the baby can be carefully monitored. How long does it take for labor to be induced? Some inductions can take up to 2-3 days. Depending on the cervix, it usually takes less time. It takes longer when you are induced early in the pregnancy or if this is your first pregnancy. If a mother is still pregnant and the induction has been going on for 2-3 days, either the mother will be sent home or a cesarean delivery will be needed. What are the risks associated with labor induction? Some of the risks of induction include:  Changes in fetal heart rate, such as too high, too low, or erratic.  Fetal distress.    Chance of infection for the mother and baby.  Increased chance of having a cesarean delivery.  Breaking off (abruption) of the placenta from the uterus (rare).  Uterine rupture (very rare). When induction is needed for medical reasons, the benefits of induction may outweigh the risks. What are some reasons for not inducing labor? Labor induction should not be done if:  It is shown that your baby does not tolerate labor.  You have had previous surgeries on your uterus, such as a myomectomy or the removal of  fibroids.  Your placenta lies very low in the uterus and blocks the opening of the cervix (placenta previa).  Your baby is not in a head-down position.  The umbilical cord drops down into the birth canal in front of the baby. This could cut off the baby's blood and oxygen supply.  You have had a previous cesarean delivery.  There are unusual circumstances, such as the baby being extremely premature. This information is not intended to replace advice given to you by your health care provider. Make sure you discuss any questions you have with your health care provider. Document Released: 11/09/2006 Document Revised: 11/26/2015 Document Reviewed: 01/17/2013 Elsevier Interactive Patient Education  2017 Elsevier Inc.  

## 2018-02-12 NOTE — Progress Notes (Signed)
Patient complaining of contractions. Patient would like to discuss induction options. Armandina StammerJennifer Cheyane Ayon RN

## 2018-02-12 NOTE — Progress Notes (Signed)
   PRENATAL VISIT NOTE  Subjective:  Joy [name] is a 27 y.o. G1P0 at 49w6dbeing seen today for ongoing prenatal care.  She is currently monitored for the following issues for this high-risk pregnancy and has Supervision of normal first pregnancy, antepartum; Rh negative state in antepartum period; Rubella non-immune status, antepartum; Allergic rhinitis; Yeast vaginitis; Group B Streptococcus carrier, antepartum; and Gestational diabetes mellitus (GDM) affecting pregnancy, antepartum on their problem list.  Patient reports no complaints.  Contractions: Irritability. Vag. Bleeding: None.  Movement: Present. Denies leaking of fluid.   The following portions of the patient's history were reviewed and updated as appropriate: allergies, current medications, past family history, past medical history, past social history, past surgical history and problem list. Problem list updated.  Objective:   Vitals:   02/12/18 1051  BP: 113/81  Pulse: 97  Weight: 138 lb (62.6 kg)    Fetal Status:     Movement: Present     General:  Alert, oriented and cooperative. Patient is in no acute distress.  Skin: Skin is warm and dry. No rash noted.   Cardiovascular: Normal heart rate noted  Respiratory: Normal respiratory effort, no problems with respiration noted  Abdomen: Soft, gravid, appropriate for gestational age.  Pain/Pressure: Present     Pelvic: Cervical exam performed        Extremities: Normal range of motion.  Edema: Trace  Mental Status: Normal mood and affect. Normal behavior. Normal judgment and thought content.   Assessment and Plan:  Pregnancy: G1P0 at 328w6d1. Supervision of normal first pregnancy, antepartum schedueld for IOL on 02/16/2018  2. Rubella non-immune status, antepartum Needs MMR PP  3. Rh negative state in antepartum period S/p Rhogam 01/03/2018  4. Group B Streptococcus carrier, antepartum Needs atbx in labor   5. Gestational diabetes mellitus (GDM) affecting  pregnancy, antepartum All fasting glc >90. >50% of her PP glc are elevated. Highest 192 For IOL in 5 days but, will begin Glyburide today 2.'5mg'$  bid     NST reviewed and reactive.  6. Hematuria UA: +leuks; +blood Urine cx  Keflex '500mg'$  bid x 5 days   Term labor symptoms and general obstetric precautions including but not limited to vaginal bleeding, contractions, leaking of fluid and fetal movement were reviewed in detail with the patient. Please refer to After Visit Summary for other counseling recommendations.  No follow-ups on file.  Future Appointments  Date Time Provider [place]8/06/2018 11:00 AM [facility] DraftsMD [facility] None  02/19/2018  8:45 AM [facility] DraftsMD [facility] None  02/26/2018  9:15 AM Ha[place] DraftsMD [facility] None    CaLavonia DraftsMD

## 2018-02-12 NOTE — Telephone Encounter (Signed)
Preadmission screen  

## 2018-02-14 ENCOUNTER — Other Ambulatory Visit: Payer: Self-pay | Admitting: Family Medicine

## 2018-02-14 LAB — URINE CULTURE

## 2018-02-15 ENCOUNTER — Telehealth: Payer: Self-pay

## 2018-02-15 NOTE — Telephone Encounter (Signed)
-----   Message from Willodean Rosenthalarolyn Harraway-Smith, MD sent at 02/14/2018  8:42 AM EDT ----- Please call pt. She does not have a UTI and can stop the atbx.  Thx, clh-S ----- Message ----- From: Festus AloeHoward, Jennifer Ledbetter, RN Sent: 02/12/2018  11:19 AM EDT To: Willodean Rosenthalarolyn Harraway-Smith, MD

## 2018-02-15 NOTE — Telephone Encounter (Signed)
Patient called and made aware that she does not have a UTI. Patient can stop the antibiotics. Patient states understanding by verbally repeating back the information.   Armandina StammerJennifer Howard RN

## 2018-02-16 ENCOUNTER — Inpatient Hospital Stay (HOSPITAL_COMMUNITY)
Admission: RE | Admit: 2018-02-16 | Discharge: 2018-02-19 | DRG: 768 | Disposition: A | Discharge status: Home / Self Care | Payer: Medicaid Other | Attending: Family Medicine | Admitting: Family Medicine

## 2018-02-16 ENCOUNTER — Encounter (HOSPITAL_COMMUNITY): Payer: Self-pay

## 2018-02-16 ENCOUNTER — Inpatient Hospital Stay (HOSPITAL_COMMUNITY): Payer: Medicaid Other | Admitting: Anesthesiology

## 2018-02-16 ENCOUNTER — Other Ambulatory Visit: Payer: Self-pay

## 2018-02-16 DIAGNOSIS — Z283 Underimmunization status: Secondary | ICD-10-CM

## 2018-02-16 DIAGNOSIS — Z3A39 39 weeks gestation of pregnancy: Secondary | ICD-10-CM

## 2018-02-16 DIAGNOSIS — O4292 Full-term premature rupture of membranes, unspecified as to length of time between rupture and onset of labor: Secondary | ICD-10-CM | POA: Diagnosis present

## 2018-02-16 DIAGNOSIS — O411290 Chorioamnionitis, unspecified trimester, not applicable or unspecified: Secondary | ICD-10-CM | POA: Diagnosis not present

## 2018-02-16 DIAGNOSIS — O99824 Streptococcus B carrier state complicating childbirth: Secondary | ICD-10-CM | POA: Diagnosis present

## 2018-02-16 DIAGNOSIS — B373 Candidiasis of vulva and vagina: Secondary | ICD-10-CM

## 2018-02-16 DIAGNOSIS — O24425 Gestational diabetes mellitus in childbirth, controlled by oral hypoglycemic drugs: Secondary | ICD-10-CM | POA: Diagnosis present

## 2018-02-16 DIAGNOSIS — B3731 Acute candidiasis of vulva and vagina: Secondary | ICD-10-CM

## 2018-02-16 DIAGNOSIS — J3089 Other allergic rhinitis: Secondary | ICD-10-CM

## 2018-02-16 DIAGNOSIS — O09899 Supervision of other high risk pregnancies, unspecified trimester: Secondary | ICD-10-CM

## 2018-02-16 DIAGNOSIS — Z2839 Other underimmunization status: Secondary | ICD-10-CM

## 2018-02-16 DIAGNOSIS — O9989 Other specified diseases and conditions complicating pregnancy, childbirth and the puerperium: Secondary | ICD-10-CM

## 2018-02-16 DIAGNOSIS — O411230 Chorioamnionitis, third trimester, not applicable or unspecified: Secondary | ICD-10-CM | POA: Diagnosis present

## 2018-02-16 DIAGNOSIS — O26893 Other specified pregnancy related conditions, third trimester: Secondary | ICD-10-CM | POA: Diagnosis present

## 2018-02-16 DIAGNOSIS — O24429 Gestational diabetes mellitus in childbirth, unspecified control: Secondary | ICD-10-CM | POA: Diagnosis not present

## 2018-02-16 DIAGNOSIS — O99820 Streptococcus B carrier state complicating pregnancy: Secondary | ICD-10-CM

## 2018-02-16 DIAGNOSIS — O0990 Supervision of high risk pregnancy, unspecified, unspecified trimester: Secondary | ICD-10-CM

## 2018-02-16 DIAGNOSIS — O26899 Other specified pregnancy related conditions, unspecified trimester: Secondary | ICD-10-CM

## 2018-02-16 DIAGNOSIS — Z6791 Unspecified blood type, Rh negative: Secondary | ICD-10-CM | POA: Diagnosis not present

## 2018-02-16 DIAGNOSIS — O24419 Gestational diabetes mellitus in pregnancy, unspecified control: Secondary | ICD-10-CM | POA: Diagnosis present

## 2018-02-16 LAB — GLUCOSE, CAPILLARY
GLUCOSE-CAPILLARY: 100 mg/dL — AB (ref 70–99)
GLUCOSE-CAPILLARY: 83 mg/dL (ref 70–99)
Glucose-Capillary: 55 mg/dL — ABNORMAL LOW (ref 70–99)
Glucose-Capillary: 68 mg/dL — ABNORMAL LOW (ref 70–99)
Glucose-Capillary: 88 mg/dL (ref 70–99)
Glucose-Capillary: 101 mg/dL — ABNORMAL HIGH (ref 70–99)
Glucose-Capillary: 76 mg/dL (ref 70–99)

## 2018-02-16 LAB — CBC
HCT: 39.3 % (ref 36.0–46.0)
Hemoglobin: 13.1 g/dL (ref 12.0–15.0)
MCH: 30.3 pg (ref 26.0–34.0)
MCHC: 33.3 g/dL (ref 30.0–36.0)
MCV: 90.8 fL (ref 78.0–100.0)
PLATELETS: 223 10*3/uL (ref 150–400)
RBC: 4.33 MIL/uL (ref 3.87–5.11)
RDW: 18.8 % — ABNORMAL HIGH (ref 11.5–15.5)
WBC: 9.3 10*3/uL (ref 4.0–10.5)

## 2018-02-16 LAB — RPR: RPR: NONREACTIVE

## 2018-02-16 MED ORDER — LACTATED RINGERS IV SOLN
INTRAVENOUS | Status: DC
  Administered 2018-02-16 – 2018-02-17 (×6): via INTRAVENOUS

## 2018-02-16 MED ORDER — SODIUM CHLORIDE 0.9 % IV SOLN
5.0000 10*6.[IU] | Freq: Once | INTRAVENOUS | Status: AC
  Administered 2018-02-16: 5 10*6.[IU] via INTRAVENOUS
  Filled 2018-02-16: qty 5

## 2018-02-16 MED ORDER — EPHEDRINE 5 MG/ML INJ
10.0000 mg | INTRAVENOUS | Status: DC | PRN
  Filled 2018-02-16: qty 2

## 2018-02-16 MED ORDER — PHENYLEPHRINE 40 MCG/ML (10ML) SYRINGE FOR IV PUSH (FOR BLOOD PRESSURE SUPPORT)
80.0000 ug | PREFILLED_SYRINGE | INTRAVENOUS | Status: DC | PRN
  Filled 2018-02-16: qty 5
  Filled 2018-02-16: qty 10

## 2018-02-16 MED ORDER — LACTATED RINGERS IV SOLN
500.0000 mL | INTRAVENOUS | Status: DC | PRN
  Administered 2018-02-16 – 2018-02-17 (×2): 500 mL via INTRAVENOUS

## 2018-02-16 MED ORDER — ONDANSETRON HCL 4 MG/2ML IJ SOLN
4.0000 mg | Freq: Four times a day (QID) | INTRAMUSCULAR | Status: DC | PRN
  Administered 2018-02-16: 4 mg via INTRAVENOUS
  Filled 2018-02-16: qty 2

## 2018-02-16 MED ORDER — TERBUTALINE SULFATE 1 MG/ML IJ SOLN
0.2500 mg | Freq: Once | INTRAMUSCULAR | Status: DC | PRN
  Filled 2018-02-16: qty 1

## 2018-02-16 MED ORDER — SOD CITRATE-CITRIC ACID 500-334 MG/5ML PO SOLN: 30.0000 mL | ORAL | Status: DC | PRN

## 2018-02-16 MED ORDER — MISOPROSTOL 50MCG HALF TABLET
50.0000 ug | ORAL_TABLET | ORAL | Status: DC | PRN
  Administered 2018-02-16 (×2): 50 ug via ORAL
  Filled 2018-02-16 (×3): qty 1

## 2018-02-16 MED ORDER — LIDOCAINE HCL (PF) 1 % IJ SOLN
INTRAMUSCULAR | Status: DC | PRN
  Administered 2018-02-16 (×2): 4 mL via EPIDURAL

## 2018-02-16 MED ORDER — PHENYLEPHRINE 40 MCG/ML (10ML) SYRINGE FOR IV PUSH (FOR BLOOD PRESSURE SUPPORT)
80.0000 ug | PREFILLED_SYRINGE | INTRAVENOUS | Status: DC | PRN
  Administered 2018-02-16 (×2): 80 ug via INTRAVENOUS
  Filled 2018-02-16: qty 5

## 2018-02-16 MED ORDER — FENTANYL 2.5 MCG/ML BUPIVACAINE 1/10 % EPIDURAL INFUSION (WH - ANES)
14.0000 mL/h | INTRAMUSCULAR | Status: DC | PRN
  Administered 2018-02-16 – 2018-02-17 (×3): 14 mL/h via EPIDURAL
  Filled 2018-02-16 (×3): qty 100

## 2018-02-16 MED ORDER — OXYTOCIN 40 UNITS IN LACTATED RINGERS INFUSION - SIMPLE MED
1.0000 m[IU]/min | INTRAVENOUS | Status: DC
  Administered 2018-02-16: 2 m[IU]/min via INTRAVENOUS
  Administered 2018-02-17: 5 m[IU]/min via INTRAVENOUS
  Administered 2018-02-17: 3 m[IU]/min via INTRAVENOUS
  Administered 2018-02-17: 7 m[IU]/min via INTRAVENOUS
  Filled 2018-02-16: qty 1000

## 2018-02-16 MED ORDER — MISOPROSTOL 25 MCG QUARTER TABLET
25.0000 ug | ORAL_TABLET | ORAL | Status: DC | PRN
  Filled 2018-02-16: qty 1

## 2018-02-16 MED ORDER — OXYTOCIN BOLUS FROM INFUSION
500.0000 mL | Freq: Once | INTRAVENOUS | Status: AC
  Administered 2018-02-17: 500 mL via INTRAVENOUS

## 2018-02-16 MED ORDER — FLEET ENEMA 7-19 GM/118ML RE ENEM: 1.0000 | ENEMA | RECTAL | Status: DC | PRN

## 2018-02-16 MED ORDER — DIPHENHYDRAMINE HCL 50 MG/ML IJ SOLN: 12.5000 mg | INTRAMUSCULAR | Status: DC | PRN

## 2018-02-16 MED ORDER — LACTATED RINGERS IV SOLN
500.0000 mL | Freq: Once | INTRAVENOUS | Status: AC
  Administered 2018-02-16: 500 mL via INTRAVENOUS

## 2018-02-16 MED ORDER — PENICILLIN G 3 MILLION UNITS IVPB - SIMPLE MED
3.0000 10*6.[IU] | INTRAVENOUS | Status: DC
  Administered 2018-02-16 (×5): 3 10*6.[IU] via INTRAVENOUS
  Filled 2018-02-16 (×2): qty 100
  Filled 2018-02-16: qty 3
  Filled 2018-02-16 (×2): qty 100
  Filled 2018-02-16 (×3): qty 3
  Filled 2018-02-16: qty 100
  Filled 2018-02-16: qty 3

## 2018-02-16 MED ORDER — FENTANYL CITRATE (PF) 100 MCG/2ML IJ SOLN
50.0000 ug | INTRAMUSCULAR | Status: DC | PRN
  Administered 2018-02-16 (×2): 100 ug via INTRAVENOUS
  Filled 2018-02-16 (×2): qty 2

## 2018-02-16 MED ORDER — LIDOCAINE HCL (PF) 1 % IJ SOLN
30.0000 mL | INTRAMUSCULAR | Status: AC | PRN
  Administered 2018-02-17: 30 mL via SUBCUTANEOUS
  Filled 2018-02-16: qty 30

## 2018-02-16 MED ORDER — OXYTOCIN 40 UNITS IN LACTATED RINGERS INFUSION - SIMPLE MED: 2.5000 [IU]/h | INTRAVENOUS | Status: DC

## 2018-02-16 NOTE — Progress Notes (Signed)
Patient ID: [name], female   DOB: 01/06/1991, 27 y.o.   MRN: [id number]  Feeling cramping; s/p cyto x 1 dose @ 0200  BP 119/76, P 87 FHR 120s, +accels, no decels Ctx q 2 mins Cx post/1+/50/-3 vtx  CBG 88  IUP@term  GDMA2- sugars stable Cx unfavorable  Cervical foley inserted without difficulty Plan Pit when it comes out  Cam HaiSHAW, Ajamu [facility] 02/16/2018 10:33 AM

## 2018-02-16 NOTE — Anesthesia Preprocedure Evaluation (Signed)
Anesthesia Evaluation  Patient identified by MRN, date of birth, ID band Patient awake    Reviewed: Allergy & Precautions, Patient's Chart, lab work & pertinent test results  Airway Mallampati: II  TM Distance: >3 FB Neck ROM: Full    Dental no notable dental hx. (+) Teeth Intact   Pulmonary neg pulmonary ROS,    Pulmonary exam normal breath sounds clear to auscultation       Cardiovascular negative cardio ROS Normal cardiovascular exam Rhythm:Regular Rate:Normal     Neuro/Psych negative neurological ROS  negative psych ROS   GI/Hepatic negative GI ROS, Neg liver ROS,   Endo/Other  diabetesGestational diabetes  Renal/GU negative Renal ROS  negative genitourinary   Musculoskeletal negative musculoskeletal ROS (+)   Abdominal   Peds  Hematology negative hematology ROS (+)   Anesthesia Other Findings Day of surgery medications reviewed with the patient.  Reproductive/Obstetrics (+) Pregnancy                             Anesthesia Physical Anesthesia Plan  ASA: II  Anesthesia Plan: Epidural   Post-op Pain Management:    Induction:   PONV Risk Score and Plan: Treatment may vary due to age or medical condition  Airway Management Planned: Natural Airway  Additional Equipment:   Intra-op Plan:   Post-operative Plan:   Informed Consent: I have reviewed the patients History and Physical, chart, labs and discussed the procedure including the risks, benefits and alternatives for the proposed anesthesia with the patient or authorized representative who has indicated his/her understanding and acceptance.     Plan Discussed with: Anesthesiologist  Anesthesia Plan Comments: (Patient identified. Risks, benefits, options discussed with patient including but not limited to bleeding, infection, nerve damage, paralysis, failed block, incomplete pain control, headache, blood pressure changes,  nausea, vomiting, reactions to medication, itching, and post partum back pain. Confirmed with bedside nurse the patient's most recent platelet count. Confirmed with the patient that they are not taking any anticoagulation, have any bleeding history or any family history of bleeding disorders. Patient expressed understanding and wishes to proceed. All questions were answered. )        Anesthesia Quick Evaluation

## 2018-02-16 NOTE — Anesthesia Pain Management Evaluation Note (Signed)
  CRNA Pain Management Visit Note  Patient: Joy HutchingSadia Zamorano, 27 y.o., female  "Hello I am a [facility]. We have an anesthesia team available at all times to provide care throughout the hospital, including epidural management and anesthesia for C-section. I don't know your plan for the delivery whether it a natural birth, water birth, IV sedation, nitrous supplementation, doula or epidural, but we want to meet your pain goals."   1.Was your pain managed to your expectations on prior hospitalizations?   No prior hospitalizations  2.What is your expectation for pain management during this hospitalization?     Epidural  3.How can we help you reach that goal? epidural  Record the patient's initial score and the patient's pain goal.   Pain: 7  Pain Goal: 7 [facility] wants you to be able to say your pain was always managed very well.  Nikisha Fleece 02/16/2018

## 2018-02-16 NOTE — Anesthesia Procedure Notes (Signed)
Epidural Patient location during procedure: OB Start time: 02/16/2018 4:15 PM End time: 02/16/2018 4:45 PM  Staffing Anesthesiologist: Elmer PickerWoodrum, Violetta Lavalle L, MD Performed: anesthesiologist   Preanesthetic Checklist Completed: patient identified, pre-op evaluation, timeout performed, IV checked, risks and benefits discussed and monitors and equipment checked  Epidural Patient position: sitting Prep: site prepped and draped and DuraPrep Patient monitoring: continuous pulse ox, blood pressure, heart rate and cardiac monitor Approach: midline Location: L3-L4 Injection technique: LOR air  Needle:  Needle type: Tuohy  Needle gauge: 17 G Needle length: 9 cm Needle insertion depth: 4 cm Catheter type: closed end flexible Catheter size: 19 Gauge Catheter at skin depth: 9 cm Test dose: negative  Assessment Sensory level: T8 Events: blood not aspirated, injection not painful, no injection resistance, negative IV test and no paresthesia  Additional Notes Reason for block:procedure for pain

## 2018-02-16 NOTE — H&P (Addendum)
LABOR AND DELIVERY ADMISSION HISTORY AND PHYSICAL NOTE  Joy [name] is a 27 y.o. female G1P0 with IUP at 29w3dby LMP presenting for IOL for A2GDM.  She reports positive fetal movement. She denies leakage of fluid or vaginal bleeding. [facility] [facility]. Having a girl, planning on breast feeding, condoms for contraception.  [facility]:  - GDM - GBS positive - Rh negative - Rubella non-immune  Past Medical History: Past Medical History:  Diagnosis Date  . Gestational diabetes   . Medical history non-contributory     Past Surgical History: Past Surgical History:  Procedure Laterality Date  . NO PAST SURGERIES      Obstetrical History: OB History    Gravida  1   Para      Term      Preterm      AB      Living  0     SAB      TAB      Ectopic      Multiple      Live Births              Social History: Social History   Socioeconomic History  . Marital status: Married    Spouse name: Not on file  . Number of children: Not on file  . Years of education: Not on file  . Highest education level: Not on file  Occupational History  . Not on file  Social Needs  . Financial resource strain: Not hard at all  . Food insecurity:    Worry: Never true    Inability: Never true  . Transportation needs:    Medical: No    Non-medical: No  Tobacco Use  . Smoking status: Never Smoker  . Smokeless tobacco: Never Used  Substance and Sexual Activity  . Alcohol use: Never    Frequency: Never  . Drug use: Never  . Sexual activity: Not Currently    Birth control/protection: None  Lifestyle  . Physical activity:    Days per week: Not on file    Minutes per session: Not on file  . Stress: Not on file  Relationships  . Social connections:    Talks on phone: Not on file    Gets together: Not on file    Attends religious service: Not on file    Active member of club or organization: Not  on file    Attends meetings of clubs or organizations: Not on file    Relationship status: Not on file  Other Topics Concern  . Not on file  Social History Narrative  . Not on file    Family History: Family History  Problem Relation Age of Onset  . Diabetes Mother     Allergies: No Known Allergies  Medications Prior to Admission  Medication Sig Dispense Refill Last Dose  . ACCU-CHEK FASTCLIX LANCETS MISC 1 Device by Percutaneous route 4 (four) times daily. DX: 024.419 check BS QID. 100 each 12   . Blood Glucose Monitoring Suppl (ACCU-CHEK GUIDE) w/Device KIT 1 Device by Does not apply route 4 (four) times a week. 1 kit 0   . cephALEXin (KEFLEX) 500 MG capsule Take 1 capsule (500 mg total) by mouth 2 (two) times daily. 14 capsule 0   . ferrous sulfate 325 (65 FE) MG EC tablet Take 325 mg by mouth 3 (three) times daily with meals.   Taking  . glucose blood (ACCU-CHEK GUIDE) test strip  DX: 024.419 check BS QID. 100 each 12   . glyBURIDE (DIABETA) 2.5 MG tablet Take 1 tablet (2.5 mg total) by mouth 2 (two) times daily with a meal. 14 tablet 0   . miconazole (MICOTIN) 100 MG vaginal suppository Place 100 mg vaginally at bedtime.   Not Taking  . Prenatal Multivit-Min-Fe-FA (PRENATAL VITAMINS PO) Take by mouth.   Taking     Review of Systems  All systems reviewed and negative except as stated in HPI  Physical Exam Last menstrual period 05/16/2017. General appearance: alert, oriented, NAD Lungs: normal respiratory effort Heart: regular rate Abdomen: soft, non-tender; gravid, FH appropriate for GA Extremities: No calf swelling or tenderness Presentation: cephalic Fetal monitoring: Cat I Uterine activity: Irregular    Prenatal labs: ABO, Rh: O/Negative/-- (06/14 1035) Antibody: Negative (06/14 1035) Rubella: <0.90 (06/14 1035) RPR: Non Reactive (06/14 1035)  HBsAg: Negative (06/14 1035)  HIV: Non Reactive (06/14 1035)  GC/Chlamydia: Negative (07/25) GBS:   Positive  (07/25) 2-hr GTT: Failed (07/26) Genetic screening:  n/a Anatomy US: Normal with suboptimal view of fetal heart and distal extremities. EFW 68%  Prenatal Transfer Tool  Maternal Diabetes: Yes:  Diabetes Type:  Insulin/Medication controlled Genetic Screening: Declined Maternal Ultrasounds/Referrals: Normal Fetal Ultrasounds or other Referrals:  None Maternal Substance Abuse:  No Significant Maternal Medications:  Meds include: Other: Glyburide 2.37m BID Significant Maternal Lab Results: Lab values include: Group B Strep positive, Rh negative  No results found for this or any previous visit (from the past 24 hour(s)).  Patient Active Problem List   Diagnosis Date Noted  . Gestational diabetes 02/16/2018  . Group B Streptococcus carrier, antepartum 01/29/2018  . Gestational diabetes mellitus (GDM) affecting pregnancy, antepartum 01/29/2018  . Allergic rhinitis 01/25/2018  . Yeast vaginitis 01/25/2018  . Rubella non-immune status, antepartum 12/25/2017  . Supervision of normal first pregnancy, antepartum 12/15/2017  . Rh negative state in antepartum period 12/15/2017    Assessment: SKenzlee Fishburnis a 27y.o. G1P0 at 36w3dere for IOL for A2GDM.  #Labor: Cytotec #Pain: Epidural if patient requests #FWB: Cat I #ID: Penicillin for GBS positive #MOF: Breast #MOC: Condoms #Circ: n/a  BrIsabel CapriceMS3 02/16/2018, 1:05 AM   I have spoken with and examined this patient and agree with resident/PA-S/Med-S/SNM's note and plan of care. VSS, HRR&R, Resp unlabored, Legs neg.  FrNigel BertholdCNM 02/16/2018 6:46 AM

## 2018-02-16 NOTE — Progress Notes (Addendum)
Patient ID: Shelbie HutchingSadia Marton, female   DOB: 11/28/1990, 28 y.o.   MRN: [id number]  Comfortable w/ epidural  BP 102/65, P 116 FHR 130s, +accels, no decels Ctx q 2 mins w/ Pit @ 141mu/min Cx 6/80/-2  CBGs since admission 76-101  IUP@term  A2GDM Early active labor  Plan to check cx in 2-3 hrs or sooner prn Will keep CBGs q4h  Cam HaiSHAW, Jamise Pentland CNM 02/16/2018 9:57 PM

## 2018-02-16 NOTE — Progress Notes (Signed)
Labor Progress Note [name] is a 27 y.o. G1P0 at 4751w3d presented for PROM. Labor was induced with pit. Now holding pit.  S:  Pt does not feel contractions.    O:  BP (!) 95/53   Pulse 91   Temp 98.3 F (36.8 C) (Oral)   Resp 16   Ht 5' (1.524 m)   Wt 63.7 kg   LMP 05/16/2017   SpO2 99%   BMI 27.44 kg/m  EFM: 120/moderate var/positive accels/no decels, recent sleep cycle  CVE: Dilation: 3.5 Effacement (%): 70 Cervical Position: Posterior Station: -3 Presentation: Vertex Exam by:: Iran OuchStephanie Russell, RN    A&P: 27 y.o. G1P0 5151w3d presenting with PROM #Labor: in the process of induction. Now holding pit 2/2 fetal distress, EFM now reassuring.  #Pain: epidural  #FWB: Category I tracing, recent sleep cycle #GBS positive   Joy MoPeter Samyukta Cura, MD 5:52 PM

## 2018-02-17 ENCOUNTER — Encounter (HOSPITAL_COMMUNITY): Payer: Self-pay

## 2018-02-17 DIAGNOSIS — Z3A39 39 weeks gestation of pregnancy: Secondary | ICD-10-CM

## 2018-02-17 DIAGNOSIS — O24429 Gestational diabetes mellitus in childbirth, unspecified control: Secondary | ICD-10-CM

## 2018-02-17 DIAGNOSIS — O99824 Streptococcus B carrier state complicating childbirth: Secondary | ICD-10-CM

## 2018-02-17 LAB — GLUCOSE, CAPILLARY
Glucose-Capillary: 100 mg/dL — ABNORMAL HIGH (ref 70–99)
Glucose-Capillary: 116 mg/dL — ABNORMAL HIGH (ref 70–99)
Glucose-Capillary: 132 mg/dL — ABNORMAL HIGH (ref 70–99)
Glucose-Capillary: 98 mg/dL (ref 70–99)

## 2018-02-17 MED ORDER — FERROUS SULFATE 325 (65 FE) MG PO TABS
325.0000 mg | ORAL_TABLET | Freq: Two times a day (BID) | ORAL | Status: DC
  Administered 2018-02-17 – 2018-02-19 (×4): 325 mg via ORAL
  Filled 2018-02-17 (×4): qty 1

## 2018-02-17 MED ORDER — ONDANSETRON HCL 4 MG/2ML IJ SOLN: 4.0000 mg | INTRAMUSCULAR | Status: DC | PRN

## 2018-02-17 MED ORDER — ACETAMINOPHEN 325 MG PO TABS: 650.0000 mg | ORAL_TABLET | Freq: Once | ORAL | Status: DC

## 2018-02-17 MED ORDER — POLYETHYLENE GLYCOL 3350 17 G PO PACK
17.0000 g | PACK | Freq: Every day | ORAL | Status: DC
  Administered 2018-02-18 – 2018-02-19 (×2): 17 g via ORAL
  Filled 2018-02-17 (×3): qty 1

## 2018-02-17 MED ORDER — SIMETHICONE 80 MG PO CHEW: 80.0000 mg | CHEWABLE_TABLET | ORAL | Status: DC | PRN

## 2018-02-17 MED ORDER — SODIUM CHLORIDE 0.9 % IV SOLN
2.0000 g | Freq: Once | INTRAVENOUS | Status: AC
  Administered 2018-02-17: 2 g via INTRAVENOUS
  Filled 2018-02-17: qty 2

## 2018-02-17 MED ORDER — BENZOCAINE-MENTHOL 20-0.5 % EX AERO
1.0000 "application " | INHALATION_SPRAY | CUTANEOUS | Status: DC | PRN
  Filled 2018-02-17: qty 56

## 2018-02-17 MED ORDER — IBUPROFEN 600 MG PO TABS
600.0000 mg | ORAL_TABLET | Freq: Four times a day (QID) | ORAL | Status: DC
  Administered 2018-02-17 – 2018-02-19 (×9): 600 mg via ORAL
  Filled 2018-02-17 (×9): qty 1

## 2018-02-17 MED ORDER — ONDANSETRON HCL 4 MG PO TABS: 4.0000 mg | ORAL_TABLET | ORAL | Status: DC | PRN

## 2018-02-17 MED ORDER — MEASLES, MUMPS & RUBELLA VAC ~~LOC~~ INJ
0.5000 mL | INJECTION | Freq: Once | SUBCUTANEOUS | Status: AC
  Administered 2018-02-19: 0.5 mL via SUBCUTANEOUS
  Filled 2018-02-17 (×2): qty 0.5

## 2018-02-17 MED ORDER — TETANUS-DIPHTH-ACELL PERTUSSIS 5-2.5-18.5 LF-MCG/0.5 IM SUSP: 0.5000 mL | Freq: Once | INTRAMUSCULAR | Status: DC

## 2018-02-17 MED ORDER — WITCH HAZEL-GLYCERIN EX PADS: 1.0000 "application " | MEDICATED_PAD | CUTANEOUS | Status: DC | PRN

## 2018-02-17 MED ORDER — ACETAMINOPHEN 325 MG PO TABS
650.0000 mg | ORAL_TABLET | ORAL | Status: DC | PRN
Start: 2018-02-17 — End: 2018-02-19

## 2018-02-17 MED ORDER — OXYCODONE-ACETAMINOPHEN 5-325 MG PO TABS: 2.0000 | ORAL_TABLET | ORAL | Status: DC | PRN

## 2018-02-17 MED ORDER — DIBUCAINE 1 % RE OINT: 1.0000 "application " | TOPICAL_OINTMENT | RECTAL | Status: DC | PRN

## 2018-02-17 MED ORDER — GENTAMICIN SULFATE 40 MG/ML IJ SOLN
5.0000 mg/kg | Freq: Once | INTRAVENOUS | Status: AC
  Administered 2018-02-17: 260 mg via INTRAVENOUS
  Filled 2018-02-17: qty 6.5

## 2018-02-17 MED ORDER — DOCUSATE SODIUM 100 MG PO CAPS
100.0000 mg | ORAL_CAPSULE | Freq: Two times a day (BID) | ORAL | Status: DC
  Administered 2018-02-18 – 2018-02-19 (×3): 100 mg via ORAL
  Filled 2018-02-17 (×3): qty 1

## 2018-02-17 MED ORDER — DIPHENHYDRAMINE HCL 25 MG PO CAPS: 25.0000 mg | ORAL_CAPSULE | Freq: Four times a day (QID) | ORAL | Status: DC | PRN

## 2018-02-17 MED ORDER — MAGNESIUM HYDROXIDE 400 MG/5ML PO SUSP: 30.0000 mL | ORAL | Status: DC | PRN

## 2018-02-17 MED ORDER — ACETAMINOPHEN 500 MG PO TABS
1000.0000 mg | ORAL_TABLET | Freq: Once | ORAL | Status: AC
  Administered 2018-02-17: 1000 mg via ORAL
  Filled 2018-02-17: qty 2

## 2018-02-17 MED ORDER — OXYCODONE-ACETAMINOPHEN 5-325 MG PO TABS
1.0000 | ORAL_TABLET | ORAL | Status: DC | PRN
  Administered 2018-02-17: 1 via ORAL
  Filled 2018-02-17: qty 1

## 2018-02-17 MED ORDER — COCONUT OIL OIL: 1.0000 "application " | TOPICAL_OIL | Status: DC | PRN

## 2018-02-17 MED ORDER — PRENATAL MULTIVITAMIN CH
1.0000 | ORAL_TABLET | Freq: Every day | ORAL | Status: DC
  Administered 2018-02-18 – 2018-02-19 (×2): 1 via ORAL
  Filled 2018-02-17 (×2): qty 1

## 2018-02-17 NOTE — Progress Notes (Signed)
Patient ID: Joy HutchingSadia [name], female   DOB: 06/29/1991, 27 y.o.   MRN: [id number]  Feeling some pressure, but mostly comfortable w/ epidural; s/p amp and gent x 1  VSS, T 98.9 FHR 130s, +accels, occ variables MVUs adequate x 1-2 hrs w/ Pit at 87mu/min Cx 9/C/0  CBG 132  IUP@term  GDMA2 Triple I Transition  Plan to check cx in 2 hrs or sooner with urge to push  Cam HaiSHAW, Noel Henandez CNM 02/17/2018 5:32 AM

## 2018-02-17 NOTE — Progress Notes (Signed)
Patient ID: Shelbie HutchingSadia Spagnuolo, female   DOB: 05/30/1991, 27 y.o.   MRN: [id number]  Feeling some pressure but no ctx; on Amp/Gent/Tylenol for fever  BP 105/48, P 86, T 99.8 (max 101.2 @ 0015) FHR 140s, avg LTV, +10x10 accels, occ mi variables Ctx q 3 min, MVUs 150ish from IUPC recently inserted Cx unchanged (7/80/-1)  IUP@term  GDMA2 Triple I Protracted active phase, suspected to inadequate ctx  Will increase Pit to achieve adequate MVUs Plan to check cx 1-2 after adq ctx  Cam HaiSHAW, Leesha Veno CNM 02/17/2018 2:43 AM

## 2018-02-17 NOTE — Lactation Note (Signed)
This note was copied from a baby's chart. Lactation Consultation Note  Patient Name: Joy [name] Reason for consult: Initial assessment   P1, Baby 3 hours old with low BS alerted by [name] RN. Reviewed hand expression with teach back.  Hand expressed approx 7 ml. Gave to baby via finger syringe and placed baby STS on mother's chest.  Noted baby is tongue thrusting. Discussed feeding cues and waiting for wide open gape. Encouraged mother to call for assistance w/ breastfeeding. Mom encouraged to feed baby 8-12 times/24 hours and with feeding cues.  Mom made aware of O/P services, breastfeeding support groups, community resources, and our phone # for post-discharge questions.    Maternal Data Has patient been taught Hand Expression?: Yes Does the patient have breastfeeding experience prior to this delivery?: No  Feeding    LATCH Score Latch: Too sleepy or reluctant, no latch achieved, no sucking elicited.  Audible Swallowing: None  Type of Nipple: Everted at rest and after stimulation  Comfort (Breast/Nipple): Soft / non-tender  Hold (Positioning): Full assist, staff holds infant at breast  LATCH Score: 4  Interventions Interventions: Hand express  Lactation Tools Discussed/Used     Consult Status Consult Status: Follow-up Date: 02/18/18 Follow-up type: In-patient    Dahlia ByesBerkelhammer, Hanifa Antonetti [facility] [name], 2:46 PM

## 2018-02-17 NOTE — Anesthesia Postprocedure Evaluation (Signed)
Anesthesia Post Note  Patient: Joy HutchingSadia Vaeth  Procedure(s) Performed: AN AD HOC LABOR EPIDURAL     Patient location during evaluation: Mother Baby Anesthesia Type: Epidural Level of consciousness: awake and alert Pain management: pain level controlled Vital Signs Assessment: post-procedure vital signs reviewed and stable Respiratory status: spontaneous breathing Cardiovascular status: blood pressure returned to baseline Postop Assessment: no headache, no backache, epidural receding, able to ambulate, adequate PO intake, no apparent nausea or vomiting and patient able to bend at knees Anesthetic complications: no    Last Vitals:  Vitals:   02/17/18 1319 02/17/18 1439  BP: 102/71 104/66  Pulse: 99 74  Resp:    Temp: 36.8 C 36.9 C  SpO2:      Last Pain:  Vitals:   02/17/18 1713  TempSrc:   PainSc: 5    Pain Goal: Patients Stated Pain Goal: 7 (02/17/18 0602)               [facility] CenterMARSHALL,Kimi Kroft

## 2018-02-18 LAB — GLUCOSE, CAPILLARY: Glucose-Capillary: 84 mg/dL (ref 70–99)

## 2018-02-18 NOTE — Progress Notes (Signed)
Post Partum Day 1 Subjective: no complaints, up ad lib, voiding and tolerating PO  Objective: Blood pressure (!) 91/58, pulse 78, temperature (!) 97.5 F (36.4 C), temperature source Oral, resp. rate 16, height 5' (1.524 m), weight 63.7 kg, last menstrual period 05/16/2017, SpO2 97 %, unknown if currently breastfeeding.  Physical Exam:  General: alert, cooperative, appears stated age and no distress Lochia: appropriate Uterine Fundus: firm Incision: n/a DVT Evaluation: No evidence of DVT seen on physical exam.  Recent Labs    02/16/18 0101  HGB 13.1  HCT 39.3    Assessment/Plan: Plan for discharge tomorrow   LOS: 2 days   Joy [name] 02/18/2018, 10:49 AM

## 2018-02-18 NOTE — Progress Notes (Signed)
POSTPARTUM PROGRESS NOTE  Post Partum Day 1 Subjective:  Joy [name] is a 27 y.o. G1P1001 6563w4d s/p SVD w/ 4th degree lac.  No acute events overnight.  Pt denies problems with ambulating, voiding or po intake.  She denies nausea or vomiting.  Pain is well controlled.  She has had flatus. She has not had bowel movement.  Lochia Large.  Pt is OOB to go to th restroom.  No lightheadedness while ambulated though some lightheadedness while resting in bed.  Pt mentioned that she is having bladder incontinence.    Objective: Blood pressure (!) 91/58, pulse 78, temperature (!) 97.5 F (36.4 C), temperature source Oral, resp. rate 16, height 5' (1.524 m), weight 63.7 kg, last menstrual period 05/16/2017, SpO2 97 %, unknown if currently breastfeeding.  Physical Exam:  General: alert, cooperative and no distress Lochia:normal flow Chest: CTAB Heart: RRR no m/r/g Abdomen: +BS, soft, nontender Uterine Fundus: firm Pelvis: Exam deferred, no blood grossly on undergarments, legs, bed.  DVT Evaluation: No calf swelling or tenderness Extremities: no edema  Recent Labs    02/16/18 0101  HGB 13.1  HCT 39.3    Assessment/Plan: ASSESSMENT: Joy [name] is a 27 y.o. G1P1001 2463w4d s/p SVD w/ 4th degree lac. HDS  #low BP Appears stable from admission. No sxs during ambulation. -will continue to monitor  #bladder incontinence Expect improvement before DC. -continue to monitor  Plan for discharge tomorrow, Breastfeeding, Lactation consult and Contraception condoms   LOS: 2 days   Mirian MoPeter Janyla Biscoe, MD 02/18/2018, 7:21 AM

## 2018-02-18 NOTE — Progress Notes (Signed)
Mother of baby was referred for her EPDS score of 9 and referral was screened out by CSW.   Please contact CSW if mother of baby requests, if needs arise, or if mother of baby scores greater than a nine or answers yes to question ten on [place] Postpartum Depression Screen.  Edwin Dadaarol Romell Wolden, MSW, LCSW-A Clinical Social Worker [facility] [phone]

## 2018-02-19 ENCOUNTER — Telehealth: Payer: Self-pay

## 2018-02-19 ENCOUNTER — Encounter: Payer: Medicaid Other | Admitting: Obstetrics & Gynecology

## 2018-02-19 ENCOUNTER — Encounter: Payer: Self-pay | Admitting: Obstetrics and Gynecology

## 2018-02-19 DIAGNOSIS — O411290 Chorioamnionitis, unspecified trimester, not applicable or unspecified: Secondary | ICD-10-CM | POA: Diagnosis not present

## 2018-02-19 MED ORDER — FERROUS SULFATE 325 (65 FE) MG PO TBEC: 325.0000 mg | DELAYED_RELEASE_TABLET | Freq: Two times a day (BID) | ORAL | 0 refills | Status: AC

## 2018-02-19 MED ORDER — SENNOSIDES-DOCUSATE SODIUM 8.6-50 MG PO TABS: 2.0000 | ORAL_TABLET | Freq: Every evening | ORAL | 0 refills | Status: DC | PRN

## 2018-02-19 MED ORDER — POLYETHYLENE GLYCOL 3350 17 G PO PACK: PACK | ORAL | 0 refills | Status: DC

## 2018-02-19 MED ORDER — OXYCODONE-ACETAMINOPHEN 5-325 MG PO TABS: 1.0000 | ORAL_TABLET | ORAL | 0 refills | Status: AC | PRN

## 2018-02-19 MED ORDER — IBUPROFEN 600 MG PO TABS: 600.0000 mg | ORAL_TABLET | Freq: Three times a day (TID) | ORAL | 0 refills | Status: DC

## 2018-02-19 NOTE — Telephone Encounter (Signed)
Patient called and made aware of check up appointment for her fourth degree laceration next Monday Armandina StammerJennifer Howard RN

## 2018-02-19 NOTE — Lactation Note (Signed)
This note was copied from a baby's chart. Lactation Consultation Note  Patient Name: Joy [name] RUEAV'WToday's Date: 02/19/2018 Reason for consult: Follow-up assessment   P1, Baby 46 hours old.  4 voids/4 stools with in the last 24 hours.  LS9. Mother denies questions or concerns.  Stools transitioning to green. Reassured mother she has plenty of breastmilk to feed her baby. Mom encouraged to feed baby 8-12 times/24 hours and with feeding cues.  Reviewed engorgement care and monitoring voids/stools. Provided mother with hand pump.    Maternal Data    Feeding Feeding Type: Breast Fed Length of feed: 50 min  LATCH Score Latch: Grasps breast easily, tongue down, lips flanged, rhythmical sucking.  Audible Swallowing: A few with stimulation  Type of Nipple: Everted at rest and after stimulation  Comfort (Breast/Nipple): Soft / non-tender  Hold (Positioning): No assistance needed to correctly position infant at breast.  LATCH Score: 9  Interventions    Lactation Tools Discussed/Used     Consult Status Consult Status: Complete Date: 02/19/18    Dahlia ByesBerkelhammer, Ruth [facility] 02/19/2018, 9:10 AM

## 2018-02-19 NOTE — Discharge Summary (Signed)
OB Discharge Summary    Patient Name: Joy [name] DOB: 01/20/1991 MRN: [id number]  Date of admission: 02/16/2018 Delivering MD: Mallie Snooks C   Date of discharge: 02/19/2018  Admitting diagnosis: INDUCTION Intrauterine pregnancy: [redacted]w[redacted]d    Secondary diagnosis:   Active Problems:   High-risk pregnancy   Rh negative state in antepartum period   Rubella non-immune status, antepartum   Gestational diabetes   Fourth degree perineal laceration affecting delivery   Chorioamnionitis  Additional problems: none   Discharge diagnosis: Term Pregnancy Delivered, 4th degree perineal laceration repaired                                                                          Post partum procedures:none Augmentation: Pitocin after PROM Complications: Chorioamnionitis, 4th degree laceration   Hospital course:   27y.o. yo G1P1001 at 361w4das admitted to the hospital 02/16/2018 for augmentation of labor in the setting of PROM for clear fluid at home. Patient's labor course was complicated by development of chorioamnionitis treated with ampicillin and gentamicin as well as 4th degree laceration requiring repair. Patient's pregnancy was complicated by A2H8IFOnd FBG was stable PPD#1 at 84. She did not require insulin during labor.  Patient had delivery of a Viable infant.  Information for the patient's newborn:  SaRickayla, Wieland0[[id number]]Delivery Method: Vaginal, Spontaneous(Filed from Delivery Summary)  Details of delivery can be found in separate delivery note.  Patient had a routine postpartum course. She has not yet had a bowel movement but is passing gas. Patient is discharged home 02/19/18.  Physical exam  Vitals:   02/18/18 0546 02/18/18 1354 02/18/18 2329 02/19/18 0550  BP: (!) 91/58 101/62 (!) 108/57 (!) 103/49  Pulse: 78 69 78 75  Resp: 16 16 17 18   Temp: (!) 97.5 F (36.4 C) 97.7 F (36.5 C) 98 F (36.7 C) 98.5 F (36.9 C)  TempSrc: Oral Oral Oral Oral  SpO2:  98% 99%    Weight:      Height:       General: alert, sitting in bed, appears comfortable Lochia: appropriate Uterine Fundus: firm Incision: laceration not examined on day of discharge DVT Evaluation: No evidence of DVT seen on physical exam. Labs: Lab Results  Component Value Date   WBC 9.3 02/16/2018   HGB 13.1 02/16/2018   HCT 39.3 02/16/2018   MCV 90.8 02/16/2018   PLT 223 02/16/2018   No flowsheet data found.  Discharge instruction: per After Visit Summary and "Baby and Me Booklet".  After visit meds:  Allergies as of 02/19/2018   No Known Allergies     Medication List    STOP taking these medications   ACCU-CHEK FASTCLIX LANCETS Misc   ACCU-CHEK GUIDE w/Device Kit   cephALEXin 500 MG capsule Commonly known as:  KEFLEX   glucose blood test strip   glyBURIDE 2.5 MG tablet Commonly known as:  DIABETA     TAKE these medications   ferrous sulfate 325 (65 FE) MG EC tablet Take 1 tablet (325 mg total) by mouth 2 (two) times daily. What changed:  when to take this   ibuprofen 600 MG tablet Commonly known as:  ADVIL,MOTRIN Take 1 tablet (600 mg total) by  mouth 3 (three) times daily.   oxyCODONE-acetaminophen 5-325 MG tablet Commonly known as:  PERCOCET/ROXICET Take 1 tablet by mouth every 4 (four) hours as needed for up to 5 days.   polyethylene glycol packet Commonly known as:  MIRALAX / GLYCOLAX Mix 1 packet in [place] beverage and drink nightly to reduce constipation.   PRENATAL VITAMINS PO Take by mouth.   senna-docusate 8.6-50 MG tablet Commonly known as:  Senokot-S Take 2 tablets by mouth at bedtime as needed for moderate constipation.      Diet: routine diet Activity: Advance as tolerated. Pelvic rest for 6 weeks.  Outpatient follow up:1 week for incision check and then in 4-6 weeks for routine postpartum follow-up. Message sent to schedule. Patient provided with clinic phone number as well.   Follow up Appt: Future Appointments  Date Time Provider  [place]  03/19/2018  9:00 AM Lavonia Drafts, MD [facility] None   Postpartum contraception: Condoms  Newborn Data: Live born female  Birth Weight: 7 lb 15 oz (3600 g) APGAR: 44, 81  Newborn Delivery   Birth date/time:  02/17/2018 10:57:00 Delivery type:  Vaginal, Spontaneous    Baby Feeding: Breast Disposition:home with mother  Lambert Mody. Juleen China, DO OB/GYN Fellow

## 2018-02-20 LAB — TYPE AND SCREEN
ABO/RH(D): O NEG
Antibody Screen: POSITIVE
UNIT DIVISION: 0
Unit division: 0

## 2018-02-20 LAB — BPAM RBC
BLOOD PRODUCT EXPIRATION DATE: 201909082359
BLOOD PRODUCT EXPIRATION DATE: 201909152359
UNIT TYPE AND RH: 9500
Unit Type and Rh: 9500

## 2018-02-22 ENCOUNTER — Telehealth: Payer: Self-pay | Admitting: General Practice

## 2018-02-22 ENCOUNTER — Telehealth: Payer: Self-pay | Admitting: Advanced Practice Midwife

## 2018-02-22 NOTE — Telephone Encounter (Signed)
[facility] indicated they withheld rx due to missing justification. I provided verbal confirmation of SVD with fourth degree lac. Pharmacist verbalized understanding and confirmed they would fill prescription with no further information required.    Called patient to confirm rx will be ready for her without further complications.  Clayton BiblesSamantha Weinhold, CNM 02/22/18  6:00 PM

## 2018-02-22 NOTE — Telephone Encounter (Signed)
Patient called & left message requesting prescription refill.

## 2018-02-26 ENCOUNTER — Ambulatory Visit (INDEPENDENT_AMBULATORY_CARE_PROVIDER_SITE_OTHER): Payer: Medicaid Other | Admitting: Obstetrics & Gynecology

## 2018-02-26 ENCOUNTER — Encounter: Payer: Medicaid Other | Admitting: Obstetrics & Gynecology

## 2018-02-26 ENCOUNTER — Encounter: Payer: Self-pay | Admitting: Obstetrics & Gynecology

## 2018-02-26 NOTE — Progress Notes (Signed)
Check vaginal tear today- 4th degree. Armandina StammerJennifer Khaalid Lefkowitz RN

## 2018-02-26 NOTE — Progress Notes (Signed)
History:  27 y.o. G1P1001 here today for eval of 4th perineal lacaeration.  Pt is s/p SVD on 02/17/2018. She reports that her pain is better. She is NOT taking narc or other pain meds at present. She is nursing. She is having BM, passing flatus and passing urine.      The following portions of the patient's history were reviewed and updated as appropriate: allergies, current medications, past family history, past medical history, past social history, past surgical history and problem list.  Review of Systems:  Pertinent items are noted in HPI.    Objective:  Physical Exam Blood pressure 121/76, pulse 81, weight 121 lb (54.9 kg), last menstrual period 05/16/2017, unknown if currently breastfeeding.  CONSTITUTIONAL: Well-developed, well-nourished female in no acute distress.  HENT:  Normocephalic, atraumatic EYES: Conjunctivae and EOM are normal. No scleral icterus.  NECK: Normal range of motion SKIN: Skin is warm and dry. No rash noted. Not diaphoretic.No pallor. NEUROLGIC: Alert and oriented to person, place, and time. Normal coordination.  Abd: Soft, nontender and nondistended Pelvic: Normal appearing external genitalia; normal appearing vaginal mucosa and cervix.  The perineum is healing well with no abnormal l swelling or masses. The rectum is intact. Normal discharge with lochia.  Small uterus, no other palpable masses, no uterine or adnexal tenderness    Assessment & Plan:  PP check of 4th degree laceration   reviewed care of laceration   F/u in 4weeks or sooner prn  Shamiah Kahler L. Harraway-Smith, M.D., Evern CoreFACOG

## 2018-03-19 ENCOUNTER — Ambulatory Visit (INDEPENDENT_AMBULATORY_CARE_PROVIDER_SITE_OTHER): Payer: Medicaid Other | Admitting: Obstetrics & Gynecology

## 2018-03-19 ENCOUNTER — Encounter: Payer: Self-pay | Admitting: Obstetrics & Gynecology

## 2018-03-19 ENCOUNTER — Other Ambulatory Visit (HOSPITAL_COMMUNITY)
Admission: RE | Admit: 2018-03-19 | Discharge: 2018-03-19 | Disposition: A | Discharge status: Home / Self Care | Payer: Medicaid Other | Source: Ambulatory Visit | Attending: Obstetrics & Gynecology | Admitting: Obstetrics & Gynecology

## 2018-03-19 DIAGNOSIS — Z1389 Encounter for screening for other disorder: Secondary | ICD-10-CM | POA: Diagnosis not present

## 2018-03-19 NOTE — Progress Notes (Signed)
Subjective:     Joy HutchingSadia [name] is a 27 y.o. female who presents for a postpartum visit. She is 4 weeks postpartum following a induction. I have fully reviewed the prenatal and intrapartum course. The delivery was at 39 gestational weeks. Outcome: spontaneous vaginal delivery. Anesthesia: epidural. Postpartum course has been unremarkable. Baby's course has been unremarkable. Baby is feeding by breast. Bleeding staining only. Bowel function is normal. Bladder function is normal. Patient is not sexually active. Contraception method is none. Postpartum depression screening: negative.  The following portions of the patient's history were reviewed and updated as appropriate: allergies, current medications, past family history, past medical history, past social history, past surgical history and problem list.  Review of Systems Pertinent items are noted in HPI.   Objective:    LMP 05/16/2017   BP (!) 96/58   Pulse 80   Ht 5' (1.524 m)   Wt 117 lb 1.3 oz (53.1 kg)   LMP 05/16/2017   BMI 22.87 kg/m   CONSTITUTIONAL: Well-developed, well-nourished female in no acute distress.  HENT:  Normocephalic, atraumatic EYES: Conjunctivae and EOM are normal. No scleral icterus.  NECK: Normal range of motion SKIN: Skin is warm and dry. No rash noted. Not diaphoretic.No pallor. NEUROLGIC: Alert and oriented to person, place, and time. Normal coordination.  Abd: soft, NT, ND GU: EGBUS: perineum healing well. No stiches noted. Intact.  Vagina: no blood in vault Cervix: no lesion; no mucopurulent d/c. PAP obtained Bimanual not done due to pt discomfort  . Assessment:    4 weeks postpartum exam. Pap smear done at today's visit.   4th degree perineal laceration healing well.   Plan:    1. Contraception: none 2. Needs 2 hour GTT. Will f/u in 2 weeks.  WIll do rectal exam and bimanual at that time  3. Pt [place] to [place] 4 [place].   Elisa Kutner L. Harraway-Smith, M.D., [facility]

## 2018-03-20 LAB — CYTOLOGY - PAP: Diagnosis: NEGATIVE

## 2018-04-02 ENCOUNTER — Ambulatory Visit (INDEPENDENT_AMBULATORY_CARE_PROVIDER_SITE_OTHER): Payer: Medicaid Other | Admitting: Obstetrics & Gynecology

## 2018-04-02 ENCOUNTER — Encounter: Payer: Self-pay | Admitting: Obstetrics & Gynecology

## 2018-04-02 VITALS — BP 89/50 | HR 71 | Ht 60.0 in | Wt 118.0 lb

## 2018-04-02 DIAGNOSIS — O24419 Gestational diabetes mellitus in pregnancy, unspecified control: Secondary | ICD-10-CM

## 2018-04-02 DIAGNOSIS — S3141XD Laceration without foreign body of vagina and vulva, subsequent encounter: Secondary | ICD-10-CM

## 2018-04-02 NOTE — Progress Notes (Signed)
History:  27 y.o. G1P1001 here today for recheck of perineum and 2 hour GTT. Pt denies abnormal sx.   The following portions of the patient's history were reviewed and updated as appropriate: allergies, current medications, past family history, past medical history, past social history, past surgical history and problem list.  Review of Systems:  Pertinent items are noted in HPI.    Objective:  Physical Exam Blood pressure (!) 89/50, pulse 71, height 5' (1.524 m), weight 118 lb (53.5 kg), last menstrual period 05/16/2017, unknown if currently breastfeeding.  CONSTITUTIONAL: Well-developed, well-nourished female in no acute distress.  HENT:  Normocephalic, atraumatic EYES: Conjunctivae and EOM are normal. No scleral icterus.  NECK: Normal range of motion SKIN: Skin is warm and dry. No rash noted. Not diaphoretic.No pallor. NEUROLGIC: Alert and oriented to person, place, and time. Normal coordination.  Abd: Soft, nontender and nondistended Pelvic: Normal appearing external genitalia; normal appearing vaginal mucosa and cervix.  Normal discharge.  Small uterus, no other palpable masses, no uterine or adnexal tenderness. A rectal was done and her rectum is intact.   Labs and Imaging No results found.  Assessment & Plan:  F/u of perineal laceration   No furhter f/u needed  May return to sexual intercoure when ready  2 hour GTT today  Pt will be moving to [place] in early Nov. Waiting on daughters passport.   Total face-to-face time with patient was 10 min.  Greater than 50% was spent in counseling and coordination of care with the patient.   Salome Cozby L. Harraway-Smith, M.D., [facility]

## 2018-04-03 LAB — GLUCOSE TOLERANCE, 2 HOURS
Glucose, 2 hour: 114 mg/dL (ref 65–139)
Glucose, GTT - Fasting: 87 mg/dL (ref 65–99)

## 2020-04-27 IMAGING — US US MFM OB DETAIL+14 WK
1 series · 14 of 28 positions shown · non-contrast
Comparison: none

[Series 1: us mfm ob detail+14 wk · 14 of 127 slices shown]
[im 5/127]
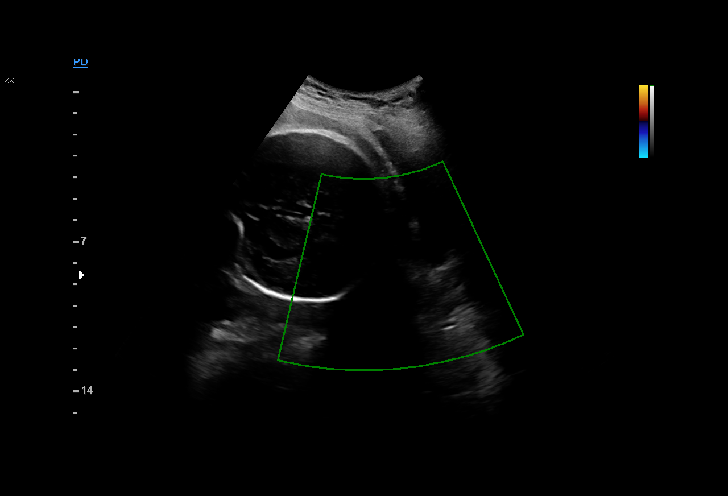
[im 15/127]
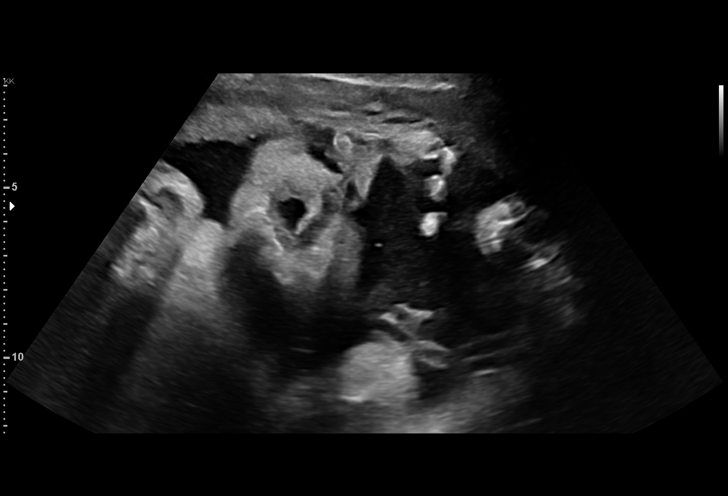
[im 24/127]
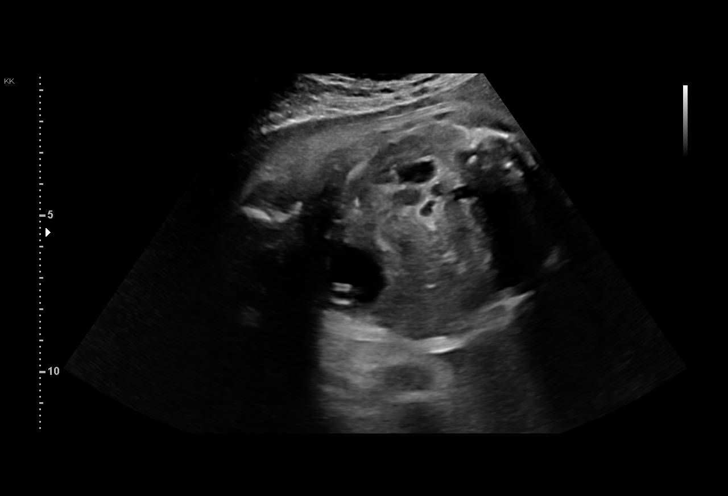
[im 33/127]
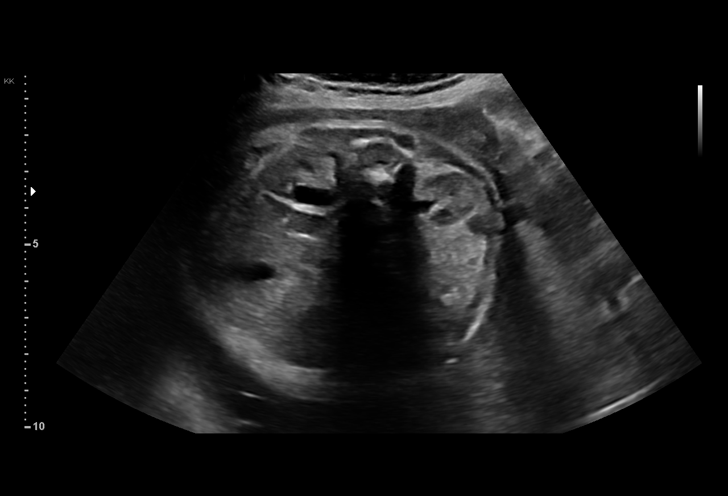
[im 43/127]
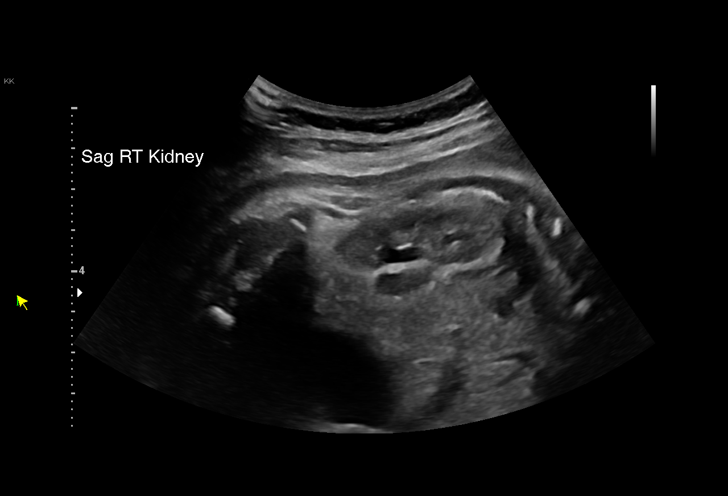
[im 52/127]
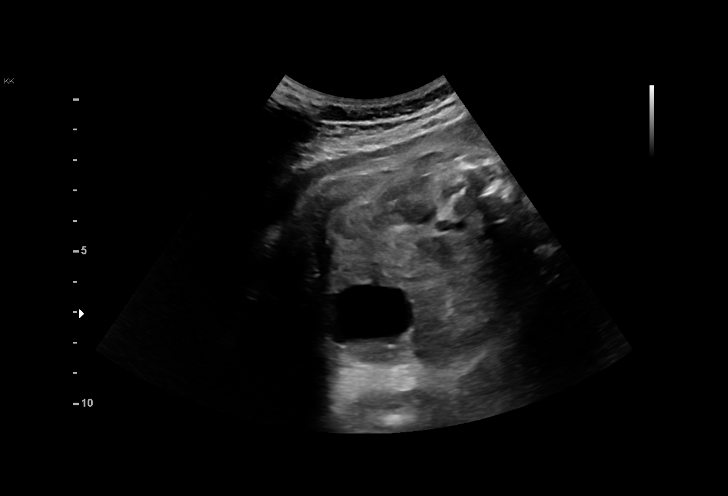
[im 61/127]
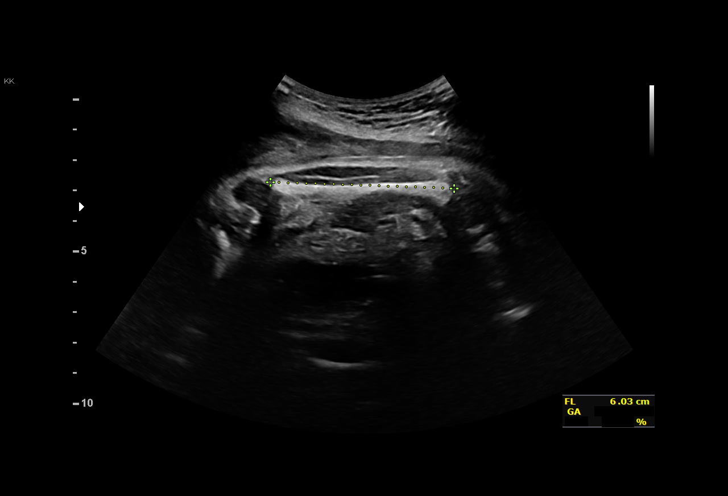
[im 71/127]
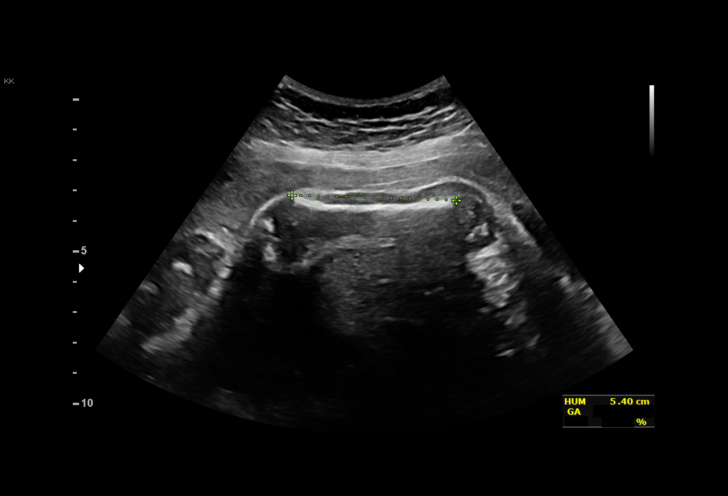
[im 80/127]
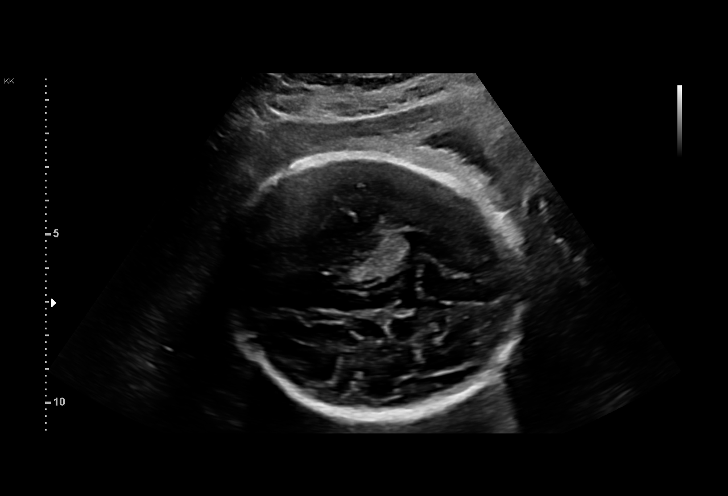
[im 89/127]
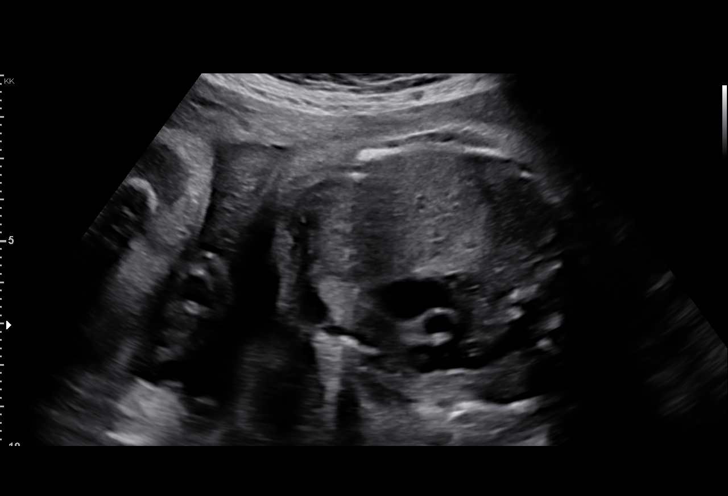
[im 99/127]
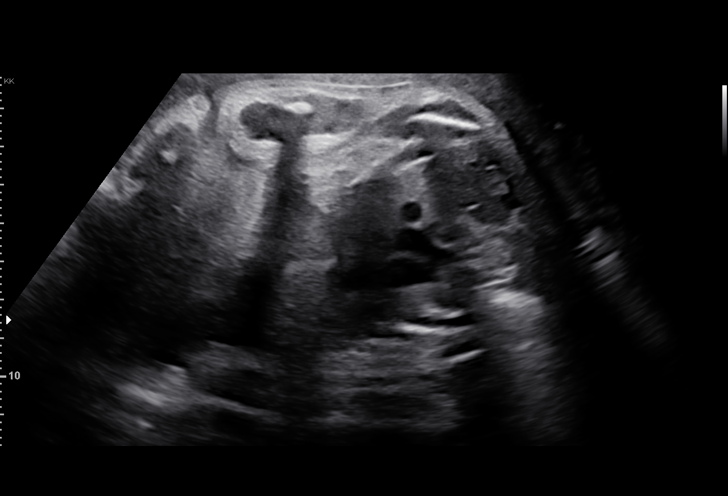
[im 108/127]
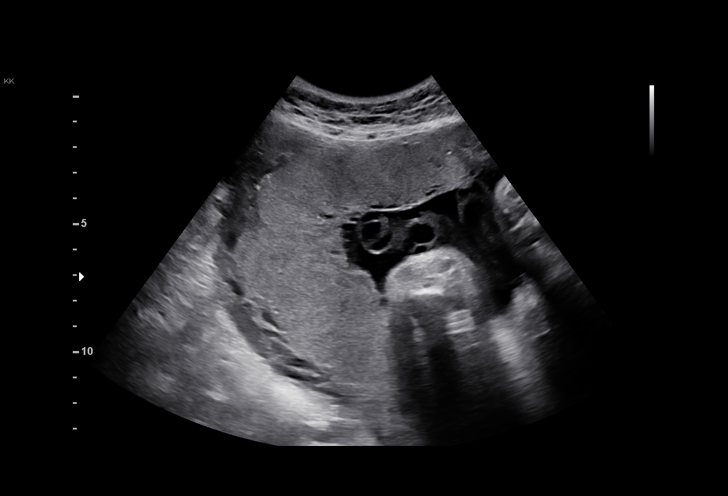
[im 117/127]
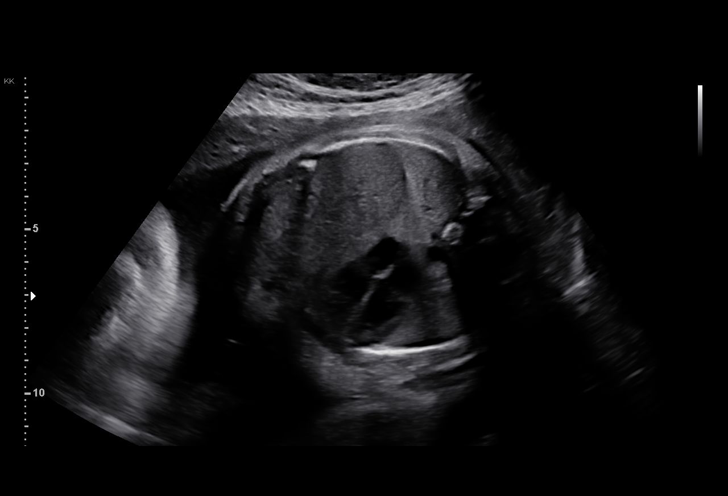
[im 127/127]
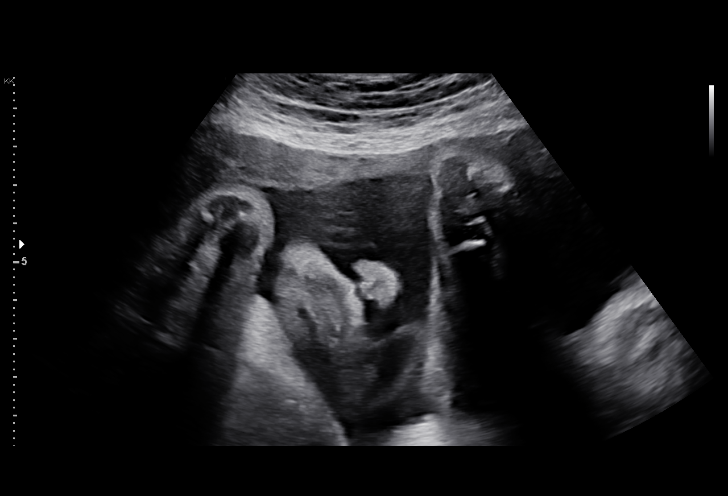

[14 of 28 positions shown; findings below may reference images not displayed]

1  [id number] IF              [id number]      [id number]     [id number]
Indications

31 weeks gestation of pregnancy
Encounter for antenatal screening for
malformations
Transfer of Care
OB History

Blood Type:            Height:  5'     Weight (lb):  126       BMI:
Gravidity:    1
Fetal Evaluation

Num Of Fetuses:     1
Fetal Heart         122
Rate(bpm):
Cardiac Activity:   Observed
Presentation:       Cephalic
Placenta:           Posterior Fundal, above cervical os
P. Cord Insertion:  Visualized

Amniotic Fluid
AFI FV:      Subjectively within normal limits

AFI Sum(cm)     %Tile       Largest Pocket(cm)
12.84           38

RUQ(cm)       RLQ(cm)       LUQ(cm)        LLQ(cm)
2.14
Biometry

BPD:      81.7  mm     G. Age:  32w 6d         80  %    CI:        78.53   %    70 - 86
FL/HC:      20.4   %    19.3 -
HC:      291.6  mm     G. Age:  32w 1d         33  %    HC/AC:      1.02        0.96 -
AC:      285.5  mm     G. Age:  32w 4d         79  %    FL/BPD:     72.8   %    71 - 87
FL:       59.5  mm     G. Age:  31w 0d         25  %    FL/AC:      20.8   %    20 - 24
HUM:        54  mm     G. Age:  31w 3d         52  %

Est. FW:    6211  gm      4 lb 3 oz     68  %
Gestational Age

LMP:           31w 3d        Date:  05/16/17                 EDD:   02/20/18
U/S Today:     32w 1d                                        EDD:   02/15/18
Best:          31w 3d     Det. By:  LMP  (05/16/17)          EDD:   02/20/18
Anatomy

Cranium:               Appears normal         Aortic Arch:            Not well visualized
Cavum:                 Appears normal         Ductal Arch:            Not well visualized
Ventricles:            Appears normal         Diaphragm:              Appears normal
Choroid Plexus:        Appears normal         Stomach:                Appears normal, left
sided
Cerebellum:            Appears normal         Abdomen:                Appears normal
Posterior Fossa:       Appears normal         Abdominal Wall:         Not well visualized
Nuchal Fold:           Not applicable (>20    Cord Vessels:           Appears normal (3
wks GA)                                        vessel cord)
Face:                  Appears normal         Kidneys:                Appear normal
(orbits and profile)
Lips:                  Appears normal         Bladder:                Appears normal
Thoracic:              Appears normal         Spine:                  Appears normal
Heart:                 Not well visualized    Upper Extremities:      Not well visualized
RVOT:                  Appears normal         Lower Extremities:      Appears normal
LVOT:                  Appears normal

Other:  Female gender. Technically difficult due to fetal position.
Cervix Uterus Adnexa

Cervix
Not visualized (advanced GA >54wks)
Impression

Singleton intrauterine pregnancy at 31+3 weeks here for
anatomic survey
Review of the anatomy shows no sonographic markers for
aneuploidy or structural anomalies
However, views of the fetal heart and distal extremities
should be considered suboptimal secondary to fetal position
Amniotic fluid volume is normal
Estimated fetal weight shows growth in the 68th percentile
Recommendations

Recommend follow-up ultrasound examination in 4 weeks for
completion of the anatomic survey
